# Patient Record
Sex: Female | Born: 1935 | Race: Black or African American | Hispanic: No | Smoking: Never smoker
Health system: Southern US, Community
[De-identification: ages and names within clinical notes are randomized; demographics above are authoritative.]

## PROBLEM LIST (undated history)

## (undated) DIAGNOSIS — I10 Essential (primary) hypertension: Secondary | ICD-10-CM

## (undated) DIAGNOSIS — I059 Rheumatic mitral valve disease, unspecified: Secondary | ICD-10-CM

## (undated) DIAGNOSIS — I2789 Other specified pulmonary heart diseases: Secondary | ICD-10-CM

## (undated) DIAGNOSIS — Z86718 Personal history of other venous thrombosis and embolism: Secondary | ICD-10-CM

## (undated) DIAGNOSIS — I079 Rheumatic tricuspid valve disease, unspecified: Secondary | ICD-10-CM

## (undated) DIAGNOSIS — E785 Hyperlipidemia, unspecified: Secondary | ICD-10-CM

## (undated) HISTORY — DX: Essential (primary) hypertension: I10

## (undated) HISTORY — PX: COLON SURGERY: SHX602

## (undated) HISTORY — DX: Rheumatic tricuspid valve disease, unspecified: I07.9

## (undated) HISTORY — PX: ABDOMINAL HYSTERECTOMY: SHX81

## (undated) HISTORY — DX: Other specified pulmonary heart diseases: I27.89

## (undated) HISTORY — DX: Personal history of other venous thrombosis and embolism: Z86.718

## (undated) HISTORY — PX: CHOLECYSTECTOMY: SHX55

## (undated) HISTORY — DX: Hyperlipidemia, unspecified: E78.5

## (undated) HISTORY — DX: Rheumatic mitral valve disease, unspecified: I05.9

---

## 2001-04-04 ENCOUNTER — Ambulatory Visit (HOSPITAL_COMMUNITY): Admission: RE | Admit: 2001-04-04 | Discharge: 2001-04-05 | Payer: Self-pay | Admitting: Internal Medicine

## 2001-04-05 ENCOUNTER — Encounter: Payer: Self-pay | Admitting: Internal Medicine

## 2001-09-27 ENCOUNTER — Encounter: Payer: Self-pay | Admitting: Internal Medicine

## 2001-09-27 ENCOUNTER — Ambulatory Visit (HOSPITAL_COMMUNITY): Admission: RE | Admit: 2001-09-27 | Discharge: 2001-09-27 | Payer: Self-pay | Admitting: Internal Medicine

## 2002-03-23 ENCOUNTER — Ambulatory Visit (HOSPITAL_COMMUNITY): Admission: RE | Admit: 2002-03-23 | Discharge: 2002-03-23 | Payer: Self-pay | Admitting: Internal Medicine

## 2002-03-23 ENCOUNTER — Encounter: Payer: Self-pay | Admitting: Internal Medicine

## 2003-04-19 ENCOUNTER — Encounter: Payer: Self-pay | Admitting: Internal Medicine

## 2003-04-19 ENCOUNTER — Ambulatory Visit (HOSPITAL_COMMUNITY): Admission: RE | Admit: 2003-04-19 | Discharge: 2003-04-19 | Payer: Self-pay | Admitting: Internal Medicine

## 2003-12-24 ENCOUNTER — Ambulatory Visit (HOSPITAL_COMMUNITY): Admission: RE | Admit: 2003-12-24 | Discharge: 2003-12-24 | Payer: Self-pay | Admitting: Internal Medicine

## 2004-03-19 ENCOUNTER — Inpatient Hospital Stay (HOSPITAL_COMMUNITY): Admission: EM | Admit: 2004-03-19 | Discharge: 2004-04-05 | Payer: Self-pay | Admitting: Emergency Medicine

## 2004-05-08 ENCOUNTER — Ambulatory Visit (HOSPITAL_COMMUNITY): Admission: RE | Admit: 2004-05-08 | Discharge: 2004-05-08 | Payer: Self-pay | Admitting: Internal Medicine

## 2004-07-07 ENCOUNTER — Ambulatory Visit: Payer: Self-pay | Admitting: *Deleted

## 2004-08-04 ENCOUNTER — Ambulatory Visit: Payer: Self-pay | Admitting: Internal Medicine

## 2004-08-29 ENCOUNTER — Ambulatory Visit: Payer: Self-pay | Admitting: *Deleted

## 2004-09-29 ENCOUNTER — Ambulatory Visit: Payer: Self-pay | Admitting: Cardiology

## 2004-10-27 ENCOUNTER — Ambulatory Visit: Payer: Self-pay | Admitting: *Deleted

## 2004-11-13 ENCOUNTER — Ambulatory Visit: Payer: Self-pay | Admitting: *Deleted

## 2004-11-14 ENCOUNTER — Ambulatory Visit: Payer: Self-pay | Admitting: *Deleted

## 2004-11-14 ENCOUNTER — Ambulatory Visit (HOSPITAL_COMMUNITY): Admission: RE | Admit: 2004-11-14 | Discharge: 2004-11-14 | Payer: Self-pay | Admitting: *Deleted

## 2004-11-27 ENCOUNTER — Ambulatory Visit: Payer: Self-pay | Admitting: *Deleted

## 2004-12-09 ENCOUNTER — Ambulatory Visit: Payer: Self-pay | Admitting: *Deleted

## 2004-12-12 ENCOUNTER — Ambulatory Visit: Payer: Self-pay | Admitting: Critical Care Medicine

## 2004-12-16 ENCOUNTER — Ambulatory Visit: Payer: Self-pay | Admitting: Internal Medicine

## 2005-01-09 ENCOUNTER — Ambulatory Visit (HOSPITAL_COMMUNITY): Admission: RE | Admit: 2005-01-09 | Discharge: 2005-01-09 | Payer: Self-pay | Admitting: Cardiovascular Disease

## 2005-01-13 ENCOUNTER — Ambulatory Visit (HOSPITAL_COMMUNITY): Admission: RE | Admit: 2005-01-13 | Discharge: 2005-01-13 | Payer: Self-pay | Admitting: Cardiovascular Disease

## 2005-01-13 ENCOUNTER — Ambulatory Visit: Payer: Self-pay | Admitting: Cardiology

## 2005-02-02 ENCOUNTER — Ambulatory Visit: Payer: Self-pay | Admitting: Critical Care Medicine

## 2005-05-06 ENCOUNTER — Emergency Department (HOSPITAL_COMMUNITY): Admission: EM | Admit: 2005-05-06 | Discharge: 2005-05-06 | Payer: Self-pay | Admitting: Emergency Medicine

## 2005-05-11 ENCOUNTER — Ambulatory Visit (HOSPITAL_COMMUNITY): Admission: RE | Admit: 2005-05-11 | Discharge: 2005-05-11 | Payer: Self-pay | Admitting: Internal Medicine

## 2005-06-25 ENCOUNTER — Ambulatory Visit: Payer: Self-pay | Admitting: Cardiology

## 2005-07-01 ENCOUNTER — Ambulatory Visit: Payer: Self-pay | Admitting: Cardiology

## 2005-10-04 IMAGING — US US EXTREM LOW VENOUS BILAT
1 series · 14 of 24 positions shown · non-contrast
Comparison: none.

CLINICAL DATA: known pulmonary emboli.
 BILATERAL LOWER EXTREMITY VENOUS DOPPLER EVALUATION

[Series 1: unknown · 14 of 39 slices shown]
[im 1/39]
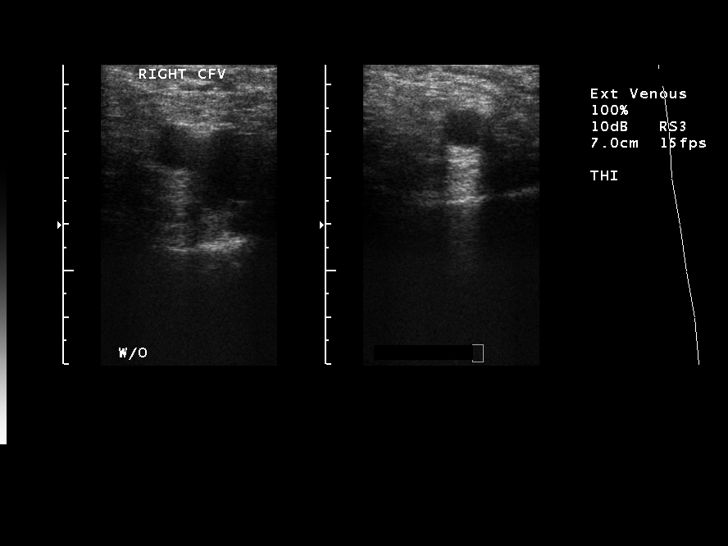
[im 4/39]
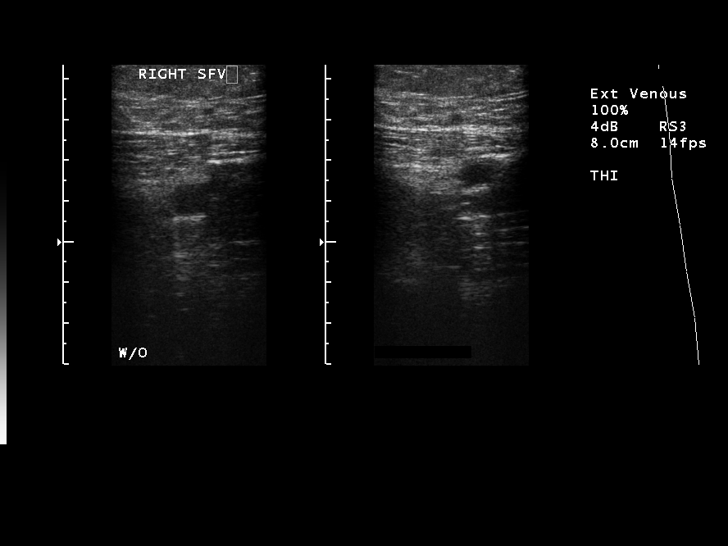
[im 7/39]
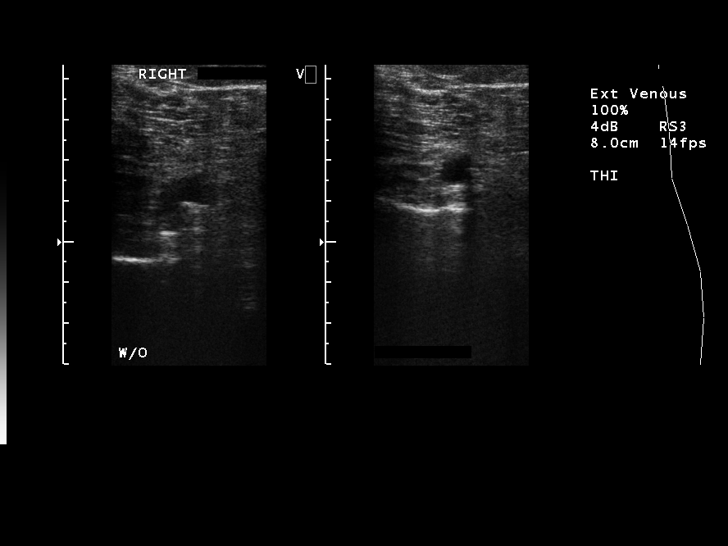
[im 10/39]
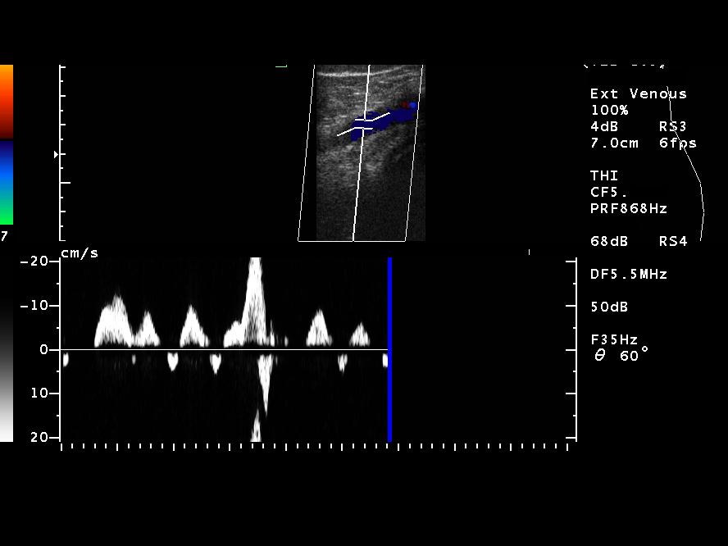
[im 12/39]
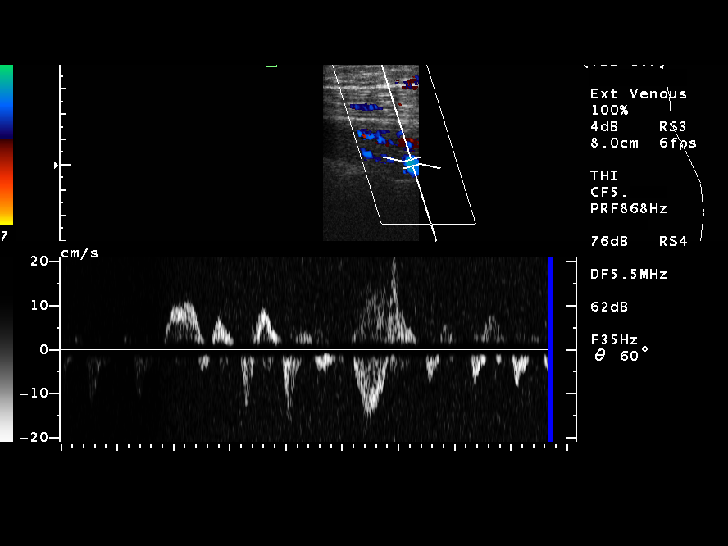
[im 15/39]
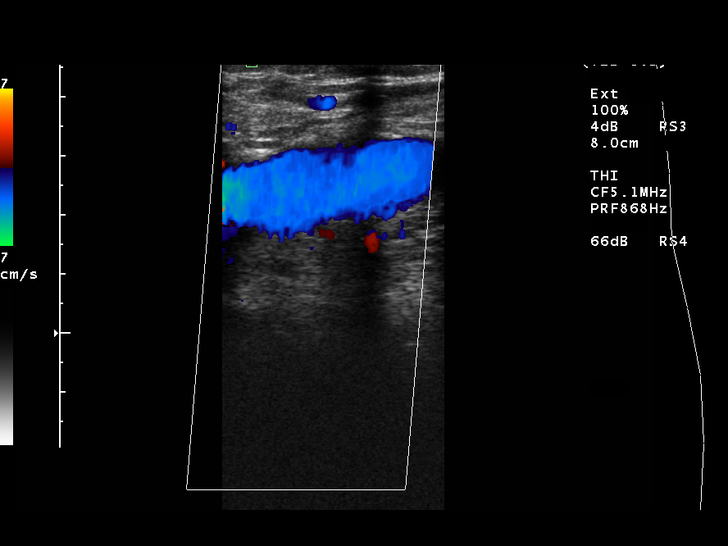
[im 19/39]
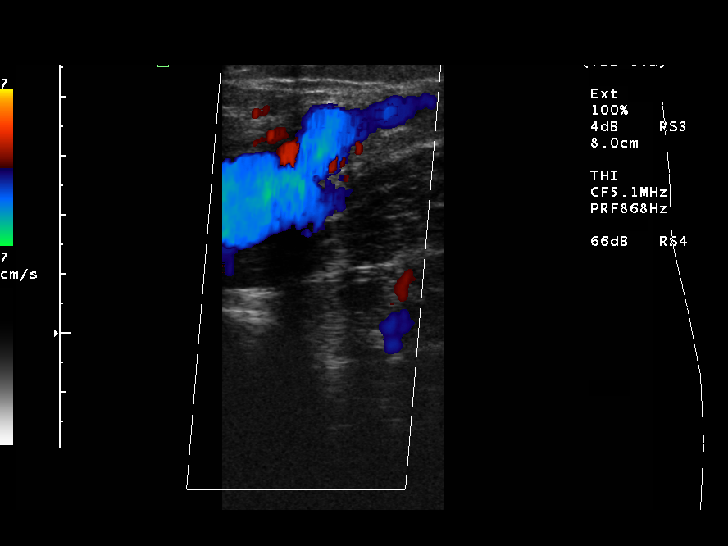
[im 20/39]
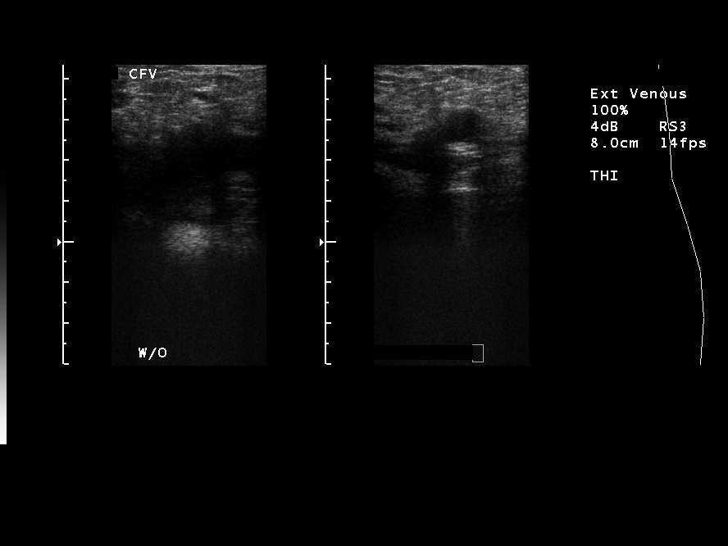
[im 24/39]
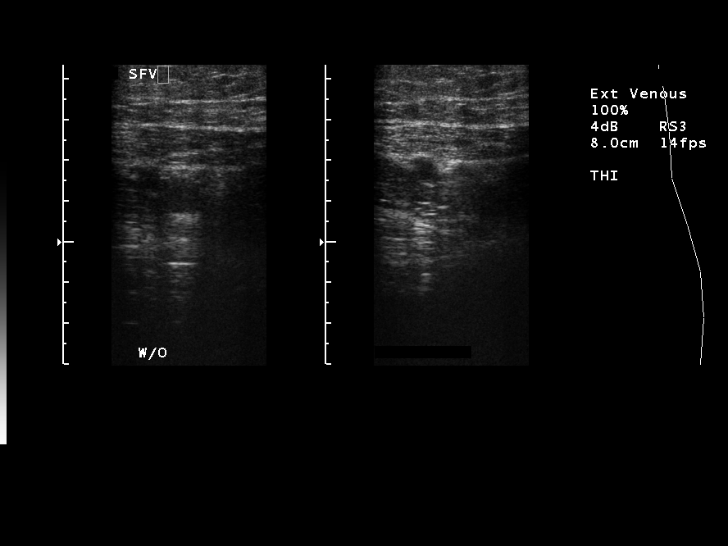
[im 27/39]
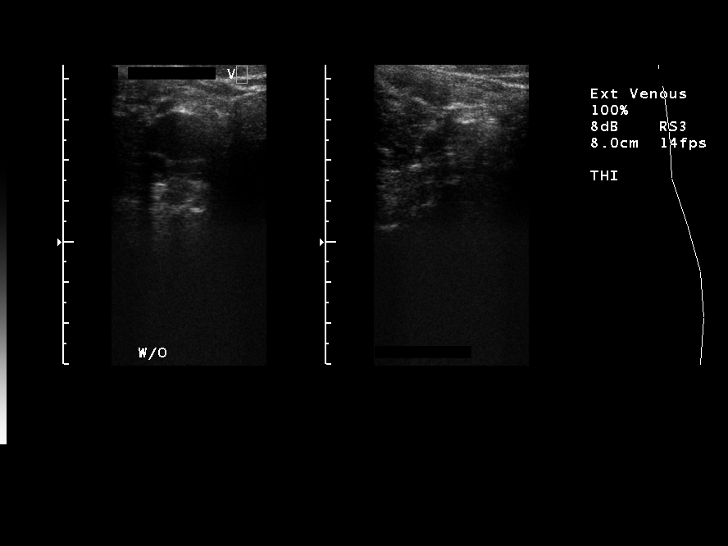
[im 30/39]
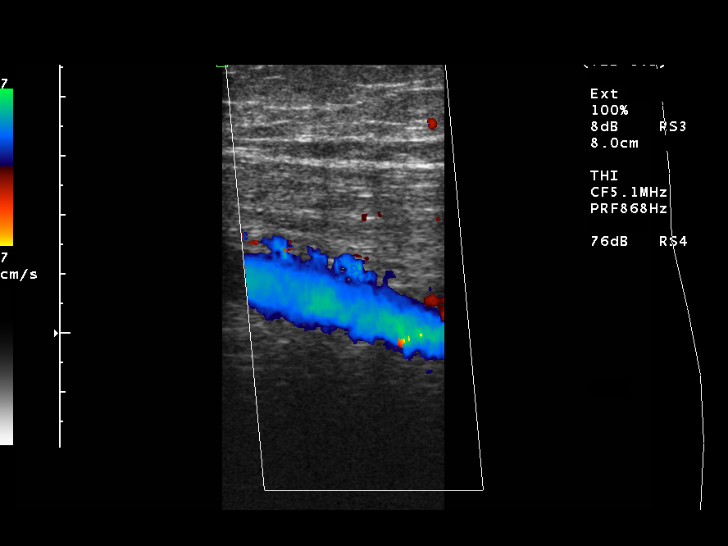
[im 32/39]
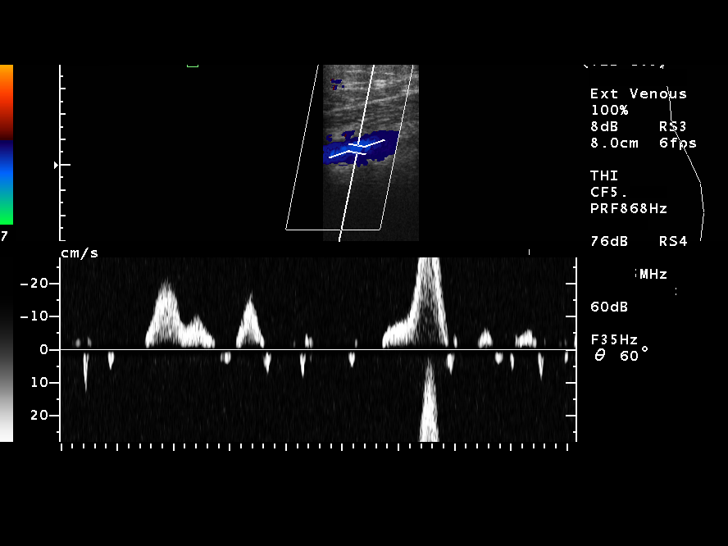
[im 35/39]
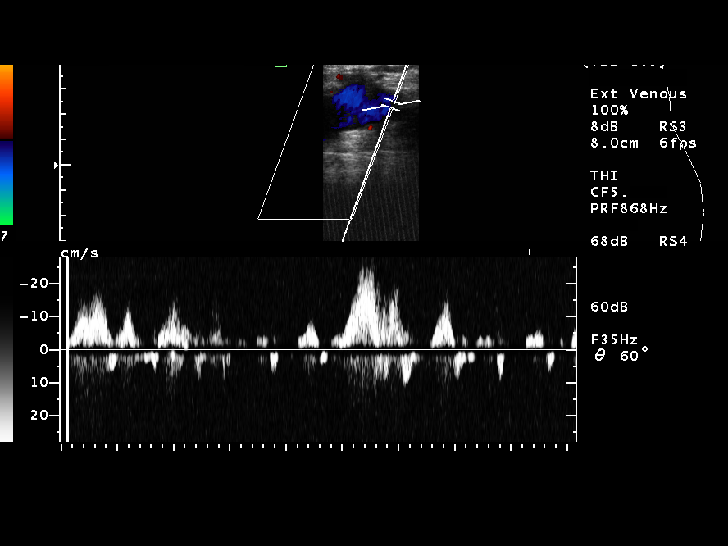
[im 39/39]
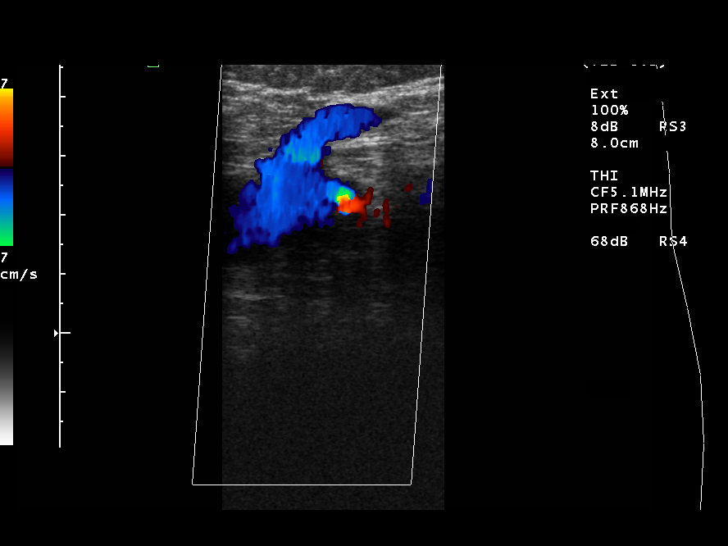

[14 of 24 positions shown; findings below may reference images not displayed]

RIGHT LOWER EXTREMITY
 The common femoral vein, superficial femoral vein, popliteal vein, and greater saphenous vein were evaluated. Each of these vessels demonstrates normal color and duplex doppler signal.  Each of the deep veins shows normal response to augmentation and normal compressibility.
 LEFT LOWER EXTREMITY
 The common femoral vein, superficial femoral vein, popliteal vein, and greater saphenous vein were evaluated. Each of these vessels demonstrates normal color and duplex doppler signal.  Each of the deep veins shows normal response to augmentation and normal compressibility.
 IMPRESSION
 Normal bilateral lower extremity venous doppler ultrasound ? no evidence of DVT.

## 2006-05-13 ENCOUNTER — Ambulatory Visit (HOSPITAL_COMMUNITY): Admission: RE | Admit: 2006-05-13 | Discharge: 2006-05-13 | Payer: Self-pay | Admitting: Internal Medicine

## 2006-05-14 ENCOUNTER — Ambulatory Visit: Payer: Self-pay | Admitting: Cardiology

## 2006-05-21 ENCOUNTER — Ambulatory Visit: Payer: Self-pay | Admitting: Cardiology

## 2007-05-20 ENCOUNTER — Ambulatory Visit (HOSPITAL_COMMUNITY): Admission: RE | Admit: 2007-05-20 | Discharge: 2007-05-20 | Payer: Self-pay | Admitting: Internal Medicine

## 2007-06-28 ENCOUNTER — Ambulatory Visit: Payer: Self-pay | Admitting: Cardiology

## 2007-06-28 ENCOUNTER — Encounter: Payer: Self-pay | Admitting: Physician Assistant

## 2007-07-05 ENCOUNTER — Ambulatory Visit: Payer: Self-pay | Admitting: Cardiology

## 2007-07-05 ENCOUNTER — Encounter: Payer: Self-pay | Admitting: Physician Assistant

## 2007-07-22 ENCOUNTER — Encounter: Payer: Self-pay | Admitting: Physician Assistant

## 2007-07-22 ENCOUNTER — Ambulatory Visit: Payer: Self-pay | Admitting: Cardiology

## 2008-05-22 ENCOUNTER — Ambulatory Visit (HOSPITAL_COMMUNITY): Admission: RE | Admit: 2008-05-22 | Discharge: 2008-05-22 | Payer: Self-pay | Admitting: Internal Medicine

## 2008-09-04 ENCOUNTER — Ambulatory Visit: Payer: Self-pay | Admitting: Cardiology

## 2008-09-13 ENCOUNTER — Ambulatory Visit: Payer: Self-pay | Admitting: Cardiology

## 2009-04-29 DIAGNOSIS — I10 Essential (primary) hypertension: Secondary | ICD-10-CM | POA: Insufficient documentation

## 2009-04-29 DIAGNOSIS — E785 Hyperlipidemia, unspecified: Secondary | ICD-10-CM | POA: Insufficient documentation

## 2009-06-03 ENCOUNTER — Ambulatory Visit (HOSPITAL_COMMUNITY): Admission: RE | Admit: 2009-06-03 | Discharge: 2009-06-03 | Payer: Self-pay | Admitting: Internal Medicine

## 2009-10-02 ENCOUNTER — Encounter: Payer: Self-pay | Admitting: Cardiology

## 2009-10-04 ENCOUNTER — Ambulatory Visit: Payer: Self-pay | Admitting: Cardiology

## 2009-10-04 DIAGNOSIS — I2789 Other specified pulmonary heart diseases: Secondary | ICD-10-CM | POA: Insufficient documentation

## 2009-10-04 DIAGNOSIS — Z86718 Personal history of other venous thrombosis and embolism: Secondary | ICD-10-CM | POA: Insufficient documentation

## 2010-01-14 ENCOUNTER — Encounter: Payer: Self-pay | Admitting: Cardiology

## 2010-06-05 ENCOUNTER — Ambulatory Visit (HOSPITAL_COMMUNITY): Admission: RE | Admit: 2010-06-05 | Discharge: 2010-06-05 | Payer: Self-pay | Admitting: Internal Medicine

## 2010-09-07 ENCOUNTER — Encounter: Payer: Self-pay | Admitting: Internal Medicine

## 2010-09-16 NOTE — Assessment & Plan Note (Signed)
Summary: 1 yr fu per jan reminder-srs   Visit Type:  Follow-up Primary Provider:  Ouida Sills  CC:  follow-up visit.  History of Present Illness: the patient is a 77 year old African-American female with a history of pulmonary hypertension.  Her pulmonary hypertension is mild.  She also has white coat hypertension.  The patient went recently to [place].  She leads an active lifestyle.  She denies any shortness of breath tightness or palpitations.  She is doing extremely well.  She denies any orthopnea PND.  She reports no syncope.  Preventive Screening-Counseling & Management  Alcohol-Tobacco     Smoking Status: never  Clinical Review Panels:  Echocardiogram Echocardiogram ejection fraction 6065%. Mild LVH Septal wall hypertrophy Diastolic filling pattern consistent with elevated mean left atrial pressure Date of his mouth moderately dilated Mild mitral regurgitation Mild tricuspid regurgitation Inferior vena cava is normal. (09/29/2009)  Cardiac Imaging Cardiac Cath Findings  CONCLUSION:  Mild pulmonary hypertension with a mean pulmonary artery  pressure of less than 25 mmHg at rest and less than 30 mmHg during exercise.  NYHA class I-II.   RECOMMENDATIONS:  1. Vasodilator testing was not performed as the patient did not meet      criteria for this test.      a. Mean pulmonary artery pressure was less than 25 mmHg and mean         pulmonary artery pressure during exertion was less than 30 mmHg.      b. The patient has a history of pulmonary embolus and thromboembolic         disease and does per se fall in the category of pulmonary arterial         hypertension benefiting from VD therapy.      c. PVR is approximately 3 Wood's units.      d. The patient is essentially asymptomatic with no significant dyspnea         and would receive no mortality benefit from vasodilator therapy.  2. Follow-up 2D echocardiographic study with saline contrast is indicated      in six months to  ascertain the stability of mild pulmonary      hypertension.  3. Follow up with Dr. Andee Lineman can be scheduled in six months if further      evaluation of pulmonary hypertension is indicated based on the      echocardiographic study.   Learta Codding, M.D. (01/14/2005)    Current Medications (verified): 1)  Aspir-Low 81 Mg Tbec (Aspirin) .... Take 1 Tablet By Mouth Once A Day 2)  Chlorthalidone 25 Mg Tabs (Chlorthalidone) .... Take 1 Tablet By Mouth Once A Day 3)  Benicar 20 Mg Tabs (Olmesartan Medoxomil) .... Take 1 Tablet By Mouth Once A Day 4)  Fish Oil 1000 Mg Caps (Omega-3 Fatty Acids) .... Take 1 Tablet By Mouth Once A Day  Allergies (verified): No Known Drug Allergies  Comments:  Nurse/Medical Assistant: The patient's medications and allergies were reviewed with the patient and were updated in the Medication and Allergy Lists. List reviewed.  Past History:  Past Medical History: Last updated: 04/29/2009 History of pulmonary hypertension.  History of tricuspid and mitral valve disease.  History of pulmonary embolism HYPERTENSION, UNSPECIFIED (ICD-401.9) HYPERLIPIDEMIA-MIXED (ICD-272.4)    Family History: Last updated: 10/04/2009 noncontributory  Social History: Last updated: 04/29/2009 Retired  Part Time  Tobacco Use - No.  Alcohol Use - no Drug Use - no  Risk Factors: Smoking Status: never (10/04/2009)  Review of Systems  The patient denies fatigue, malaise, fever, weight gain/loss, vision loss, decreased hearing, hoarseness, chest pain, palpitations, shortness of breath, prolonged cough, wheezing, sleep apnea, coughing up blood, abdominal pain, blood in stool, nausea, vomiting, diarrhea, heartburn, incontinence, blood in urine, muscle weakness, joint pain, leg swelling, rash, skin lesions, headache, fainting, dizziness, depression, anxiety, enlarged lymph nodes, easy bruising or bleeding, and environmental allergies.    Vital Signs:  Patient profile:   77  year old female Height:      65 inches Weight:      183 pounds BMI:     30.56 Pulse rate:   69 / minute BP sitting:   144 / 72  (left arm) Cuff size:   regular  Vitals Entered By: Carlye Grippe (October 04, 2009 8:58 AM)  Nutrition Counseling: Patient's BMI is greater than 25 and therefore counseled on weight management options. CC: follow-up visit   Physical Exam  Additional Exam:  General: Well-developed, well-nourished in no distress head: Normocephalic and atraumatic eyes PERRLA/EOMI intact, conjunctiva and lids normal nose: No deformity or lesions mouth normal dentition, normal posterior pharynx neck: Supple, no JVD.  No masses, thyromegaly or abnormal cervical nodes lungs: Normal breath sounds bilaterally without wheezing.  Normal percussion heart: regular rate and rhythm with normal S1 and S2, no S3 or S4.  PMI is normal.  No pathological murmurs abdomen: Normal bowel sounds, abdomen is soft and nontender without masses, organomegaly or hernias noted.  No hepatosplenomegaly musculoskeletal: Back normal, normal gait muscle strength and tone normal pulsus: Pulse is normal in all 4 extremities Extremities: No peripheral pitting edema neurologic: Alert and oriented x 3 skin: Intact without lesions or rashes cervical nodes: No significant adenopathy psychologic: Normal affect    EKG  Procedure date:  10/04/2009  Findings:      normal sinus rhythm.  Occasional PVCs.  Nonspecific ST-T wave changes.  Heart rate 69 beats/min QTC 409 ms  Impression & Recommendations:  Problem # 1:  HYPERTENSION, UNSPECIFIED (ICD-401.9) the patient has white coat hypertension.  She reports however that normally her blood pressure within normal range.  I made no changes in her medical regimen. The following medications were removed from the medication list:    Lisinopril-hydrochlorothiazide 20-25 Mg Tabs (Lisinopril-hydrochlorothiazide) .Marland Kitchen... Take 1 tablet by mouth once a day    Norvasc  2.5 Mg Tabs (Amlodipine besylate) .Marland Kitchen... Take 1 tablet by mouth once a day Her updated medication list for this problem includes:    Aspir-low 81 Mg Tbec (Aspirin) .Marland Kitchen... Take 1 tablet by mouth once a day    Chlorthalidone 25 Mg Tabs (Chlorthalidone) .Marland Kitchen... Take 1 tablet by mouth once a day    Benicar 20 Mg Tabs (Olmesartan medoxomil) .Marland Kitchen... Take 1 tablet by mouth once a day  Problem # 2:  PULMONARY HYPERTENSION, MILD (ICD-416.8) patient has mild pulmonary hypertension.  We will consider follow-up echocardiogram in one year.  She reports no symptoms of right-sided heart failure. Orders: EKG w/ Interpretation (93000)  Problem # 3:  HYPERLIPIDEMIA-MIXED (ICD-272.4) the patient's blood work is followed by Dr. Ouida Sills.  Patient Instructions: 1)  Your physician recommends that you continue on your current medications as directed. Please refer to the Current Medication list given to you today. 2)  Follow up in  1 year.

## 2010-10-29 ENCOUNTER — Encounter: Payer: Self-pay | Admitting: Cardiology

## 2010-10-29 ENCOUNTER — Ambulatory Visit (INDEPENDENT_AMBULATORY_CARE_PROVIDER_SITE_OTHER): Payer: MEDICARE | Admitting: Cardiology

## 2010-10-29 DIAGNOSIS — I10 Essential (primary) hypertension: Secondary | ICD-10-CM

## 2010-11-13 NOTE — Assessment & Plan Note (Signed)
Summary: 1 YR FUL FH   Visit Type:  Follow-up Primary Provider:  Ouida Sills   History of Present Illness: The patient is a 77 year old African-American female with history of pulmonary hypertension.  She only has mild pulmonary hypertension.  She also has white coat systemic hypertension.  During the last visit she reported no complaints.  She was doing well.  Her catheterization her mean pulmonary artery pressure was 25 mm of mercury.  The patient also has a history off  pulmonary embolism hypertension and dyslipidemia. The patient reports no symptoms.  She has been doing well here if she has no chest pain or shortness of breath.  She has gained some weight.  However there is no evidence of fluid overload.  Splenic to take a trip to [place] in the next few months. EKG demonstrates normal sinus rhythm with no acute ischemic changes.  Her blood personnel controlled.   Preventive Screening-Counseling & Management  Alcohol-Tobacco     Smoking Status: never  Current Medications (verified): 1)  Aspir-Low 81 Mg Tbec (Aspirin) .... Take 1 Tablet By Mouth Once A Day 2)  Chlorthalidone 25 Mg Tabs (Chlorthalidone) .... Take 1 Tablet By Mouth Once A Day 3)  Benicar 20 Mg Tabs (Olmesartan Medoxomil) .... Take 1 Tablet By Mouth Once A Day 4)  Fish Oil 1000 Mg Caps (Omega-3 Fatty Acids) .... Take 1 Tablet By Mouth Once A Day 5)  Simvastatin 10 Mg Tabs (Simvastatin) .... Take 1 Tablet By Mouth Once A Day  Allergies (verified): No Known Drug Allergies  Comments:  Nurse/Medical Assistant: The patient's medication list and allergies were reviewed with the patient and were updated in the Medication and Allergy Lists.  Past History:  Past Medical History: Last updated: 04/29/2009 History of pulmonary hypertension.  History of tricuspid and mitral valve disease.  History of pulmonary embolism HYPERTENSION, UNSPECIFIED (ICD-401.9) HYPERLIPIDEMIA-MIXED (ICD-272.4)    Family History: Last updated:  10/04/2009 noncontributory  Social History: Last updated: 04/29/2009 Retired  Part Time  Tobacco Use - No.  Alcohol Use - no Drug Use - no  Risk Factors: Smoking Status: never (10/29/2010)  Vital Signs:  Patient profile:   77 year old female Height:      65 inches Weight:      191 pounds BMI:     31.90 Pulse rate:   56 / minute BP sitting:   116 / 71  (left arm) Cuff size:   large  Vitals Entered By: Carlye Grippe (October 29, 2010 11:37 AM)  Nutrition Counseling: Patient's BMI is greater than 25 and therefore counseled on weight management options.  Physical Exam  Additional Exam:  General: Well-developed, well-nourished in no distress head: Normocephalic and atraumatic eyes PERRLA/EOMI intact, conjunctiva and lids normal nose: No deformity or lesions mouth normal dentition, normal posterior pharynx neck: Supple, no JVD.  No masses, thyromegaly or abnormal cervical nodes lungs: Normal breath sounds bilaterally without wheezing.  Normal percussion heart: regular rate and rhythm with normal S1 and S2, no S3 or S4.  PMI is normal.  No pathological murmurs abdomen: Normal bowel sounds, abdomen is soft and nontender without masses, organomegaly or hernias noted.  No hepatosplenomegaly musculoskeletal: Back normal, normal gait muscle strength and tone normal pulsus: Pulse is normal in all 4 extremities Extremities: No peripheral pitting edema neurologic: Alert and oriented x 3 skin: Intact without lesions or rashes cervical nodes: No significant adenopathy psychologic: Normal affect    Impression & Recommendations:  Problem # 1:  PULMONARY HYPERTENSION, MILD (ICD-416.8)  mild pulmonary hypertension: no evidence of symptoms.  I do not think the patient needs a repeat echocardiogram.  Problem # 2:  HYPERTENSION, UNSPECIFIED (ICD-401.9) white coat hypertension stable Her updated medication list for this problem includes:    Aspir-low 81 Mg Tbec (Aspirin) .Marland Kitchen... Take 1  tablet by mouth once a day    Chlorthalidone 25 Mg Tabs (Chlorthalidone) .Marland Kitchen... Take 1 tablet by mouth once a day    Benicar 20 Mg Tabs (Olmesartan medoxomil) .Marland Kitchen... Take 1 tablet by mouth once a day  Orders: EKG w/ Interpretation (93000)  Problem # 3:  PULMONARY EMBOLISM, HX OF (ICD-V12.51) pulmonary embolism will no recurrence Her updated medication list for this problem includes:    Aspir-low 81 Mg Tbec (Aspirin) .Marland Kitchen... Take 1 tablet by mouth once a day  Problem # 4:  HYPERLIPIDEMIA-MIXED (ICD-272.4) dyslipidemia: The patient will follow-up with Dr. Ouida Sills regarding blood work which is scheduled for June. Her updated medication list for this problem includes:    Simvastatin 10 Mg Tabs (Simvastatin) .Marland Kitchen... Take 1 tablet by mouth once a day  Patient Instructions: 1)  Your physician wants you to follow-up in: 1 year. You will receive a reminder letter in the mail one-two months in advance. If you don't receive a letter, please call our office to schedule the follow-up appointment. 2)  Your physician recommends that you continue on your current medications as directed. Please refer to the Current Medication list given to you today.

## 2010-12-30 NOTE — Assessment & Plan Note (Signed)
[facility]                          [facility] OFFICE NOTE   NAME:[name], [name] ALVIAR                    MRN:          [id number]  DATE:06/28/2007                            DOB:          [birth year]    CARDIOLOGIST:  [name] Codding, MD,FACC   PRIMARY CARE PHYSICIAN:  [name] Callander. Ouida Sills, MD   REASON FOR VISIT:  One year followup.   HISTORY OF PRESENT ILLNESS:  [name] [name] is a very pleasant 77 year old  female patient with a history of pulmonary embolism in 2005 after  surgery.  Right heart catheterization in 2006 revealed mild pulmonary  hypertension.  She has seen Dr. Delford [name] in the past.  She presents to the  office today for routine followup.   Overall, the patient is doing well.  She denies any chest pain or  shortness of breath.  She denies any syncope or presyncope.  Denies any  palpitations.  Denies any orthopnea, PND or pedal edema.   CURRENT MEDICATIONS:  1. Aspirin 81 mg daily.  2. Lisinopril 10 mg daily.  3. Klonopin p.r.n.   PHYSICAL EXAMINATION:  GENERAL: She is a well-nourished, well-developed  female, no acute distress.  VITAL SIGNS:  Blood pressure is 168/95, pulse 55, weight 180.8 pounds.  Repeat blood pressure by me by manual cuff is 162/90 on the left, 170/92  on the right.  HEENT:  Normal.  NECK:  Without JVD.  CARDIAC:  Regular S1, S2.  Regular rate and rhythm without murmur.  LUNGS:  Clear to auscultation bilaterally without wheezes, rales or  rhonchi.  ABDOMEN:  Soft, nontender, normoactive bowel sounds.  No organomegaly.  EXTREMITIES:  Without edema.  Calves are soft and nontender.  SKIN:  Warm and dry.  NEUROLOGICAL:  Alert and oriented x3.  Cranial nerves II-XII are grossly  intact.   STUDIES:  Electrocardiogram reveals sinus rhythm with a heart rate of  67, normal axis.  No acute changes.  Occasional PACs.   IMPRESSION:  1. Mild pulmonary hypertension.  2. History of pulmonary embolus status post surgery.  3.  Hypertension, uncontrolled.  4. History of dyslipidemia, subsequently no medical therapy at this      time.   PLAN:  1. The patient presents today for follow up.  Overall, she is doing      well.  According to the notes, she is supposed to have an      echocardiogram performed last year.  However, this was never      performed.  We will go ahead and send her for a repeat      echocardiogram to reassess her pulmonary hypertension.  2. Her blood pressure is uncontrolled.  I recommended we change her      Lisinopril to Lisinopril/HCTZ 10/12.5 mg daily.  She will have a B-      met in a week and a blood pressure check with the nurse.  3. We will see the patient back in follow up in one year or sooner      p.r.n.      Tereso Newcomer, PA-C  Electronically Signed  [name] Codding, MD,FACC  Electronically Signed   SW/MedQ  DD: 06/28/2007  DT: 06/29/2007  Job #: [id number]   cc:   [name] Callander. Ouida Sills, MD

## 2010-12-30 NOTE — Assessment & Plan Note (Signed)
[facility]                          [facility]ICE NOTE   NAME:[name] [name], [name] [name]                    MRN:          [id number]  DATE:09/04/2008                            DOB:          May 15, 1933    HISTORY OF PRESENT ILLNESS:  The patient is a pleasant 77 year old  female with history of pulmonary embolism in 2005, after surgery.  She  does have known valvular heart disease with tricuspid and mitral  regurgitation.  However, the patient appears to be in NYHA Class I.  She  reports no chest pain or shortness of breath.  She has no orthopnea or  PND.  Her main complaints are some complaints of abdominal gas.   MEDICATIONS:  1. Aspirin 81 mg p.o. daily.  2. Chlorthalidone 25 mg p.o. daily.  3. Benicar 20 mg p.o. daily.   PHYSICAL EXAMINATION:  VITAL SIGNS:  Blood pressure is 118/60, heart  rate 70, and weight is 179 pounds.  NECK:  Normal carotid upstroke.  No carotid bruits.  LUNGS:  Clear breath sounds bilaterally.  HEART:  Regular rate and rhythm.  Normal S1 and S2.  No murmurs, rubs,  or gallops.  ABDOMEN:  Soft and nontender.  No rebound or guarding.  Good bowel  sounds.  EXTREMITIES:  No cyanosis, clubbing, or edema.   A 12-lead electrocardiogram, normal sinus rhythm, nonspecific ST-T wave  changes.   PROBLEMS:  1. History of pulmonary embolism status post surgery, off Coumadin.  2. Hypertension, controlled.  3. Dyslipidemia, followed by Dr. Ouida Sills.  4. History of pulmonary hypertension.  5. History of tricuspid and mitral valve disease.   PLAN:  1. Although, the patient is NYHA Class I, she did have significant      mitral and tricuspid valve disease, and we will have to follow up 2-      D echocardiographic study.  2. No further adjustment of her medication is needed for her blood      pressure as it is nicely controlled.  3. The patient can follow up with [place] in 1 year.    Learta Codding, MD,FACC  Electronically Signed   GED/MedQ  DD: 09/04/2008  DT: 09/05/2008  Job #: [id number]   cc:   Kingsley Callander. Ouida Sills, MD

## 2011-01-02 NOTE — Procedures (Signed)
NAME:  [name] [name], [name] [name]                       ACCOUNT NO.:  [id number]   MEDICAL RECORD NO.:  [id number]                   PATIENT TYPE:  INP   LOCATION:  A325                                 FACILITY:  [facility]   PHYSICIAN:  Vida Roller, M.D.                DATE OF BIRTH:  [birth year]   DATE OF PROCEDURE:  DATE OF DISCHARGE:                                  ECHOCARDIOGRAM   TAPE NUMBER:  [id number], tape count 0 through 482.   INDICATIONS:  A 77 year old woman preoperative for cholecystectomy.  This is  an assessment for LV function.   TECHNICAL QUALITY:  Adequate.   M-MODE TRACING:  1. Aorta is 25 mm.  2. Left atrium 41 mm.  3. Septum 15 mm.  4. Left ventricular posterior wall 11 mm.  5. Left ventricular diastolic dimension 38 mm.  6. Left ventricular systolic dimension 24 mm.   2-D AND DOPPLER IMAGING:  1. The left ventricle is normal size with vigorous systolic function with an     estimated ejection fraction of 70 to 75%.  There is evidence of upper     septal hypertrophy without any obvious left ventricular outflow gradient.     Overall, there is very-mild, concentric left ventricular hypertrophy.     Diastolic function was not assessed.  2. The right ventricle is normal size with normal systolic function.  There     is evidence of increased right ventricular systolic pressure with a     tricuspid regurgitation jet of about 3.1 meters per second, giving an     estimated right ventricular systolic pressure of between 35 and 40 mmHg.  3. Both atria appear to be mildly enlarged, left greater than right.  The     atrial septum is bowed to the right, indicating left atrial volume     overload.  4. The aortic valve is sclerotic with no evidence of stenosis or     regurgitation.  5. The mitral valve is mildly myomatous with mild regurgitation.  No     stenosis is seen.  6. The tricuspid valve is not well seen, but there is mild regurgitation.  7. The pulmonic valve is not well  seen.  8. The inferior vena cava is mildly dilated.  9. The ascending aorta was not well seen.  10.      Pericardial structures appear normal.   These results will be forwarded to Dr. Katrinka Blazing via phone.      ___________________________________________                                            Vida Roller, M.D.   JH/MEDQ  D:  03/21/2004  T:  03/21/2004  Job:  [id number]   cc:   Jerolyn Shin C. Katrinka Blazing, M.D.  [address]  [place]  [place] [address]  Fax: [phone]

## 2011-01-02 NOTE — Cardiovascular Report (Signed)
NAMETRULEE, HAMSTRA NO.:  [id number]   MEDICAL RECORD NO.:  [id number]          PATIENT TYPE:  OIB   LOCATION:  2856                         FACILITY:  [facility]   PHYSICIAN:  Learta Codding, M.D. LHCDATE OF BIRTH:  08-Mar-1933   DATE OF PROCEDURE:  01/14/2005  DATE OF DISCHARGE:  01/13/2005                              CARDIAC CATHETERIZATION   PROCEDURE:  Right heart catheterization.   CARDIOLOGIST:  Vida Roller, M.D.   PROCEDURE PERFORMED:  Right heart catheterization.   INDICATIONS:  The patient is a 77 year old female with a history of prior  pulmonary embolism in 2005 after hernia surgery.  Follow-up CT scan  demonstrated no residual pulmonary embolism.  The patient is NYHA class I-  II.  A 2D echocardiographic study demonstrated possible residual pulmonary  hypertension and the patient has been referred for right heart  catheterization to assess her pulmonary artery pressures and possible need  for long-term vasodilator therapy and vasodilator therapy reactive testing.  The patient also has a history of significant hypertension and has left  ventricular hypertrophy by echocardiography.   DESCRIPTION OF PROCEDURE:  After informed consent was obtained, the patient  was brought to the catheterization laboratory.  The right groin was  sterilely prepped and draped.  A standard Swan-Ganz catheter was inserted  through the right femoral vein using the modified Seldinger technique after  an 8 French sheath was placed.  Appropriate right-sided hemodynamics were  obtained as well as cardiac output.  Adenosine was not infused due to the  fact that the patient did not have significant pulmonary hypertension, did  not meet criteria for vasodilator testing.   COMPLICATIONS:  None.   FINDINGS:  a.  Resting hemodynamics:  Pulmonary artery pressure 35/50/23  mmHg.  Pulmonary capillary wedge pressure mean of 13 mmHg.  Peak cardiac  output 4.1 liters per minute.  Peak  cardiac index 2.14 liters per minute.  Cardiac output by thermodilution 5 liters per minute.  Cardiac index by  thermodilution 2.6 liters per minute.  PVR was 240 dynes per m2,  (approximately 3 Wood's units).  b.  Hemodynamics during exercise.  Right atrial pressure was 12/11/9 mmHg.  Ventricular pressure was 41/18/11 mmHg.  Pulmonary artery pressure was  42/17/27 mmHg.   CONCLUSION:  Mild pulmonary hypertension with a mean pulmonary artery  pressure of less than 25 mmHg at rest and less than 30 mmHg during exercise.  NYHA class I-II.   RECOMMENDATIONS:  1. Vasodilator testing was not performed as the patient did not meet      criteria for this test.      a. Mean pulmonary artery pressure was less than 25 mmHg and mean         pulmonary artery pressure during exertion was less than 30 mmHg.      b. The patient has a history of pulmonary embolus and thromboembolic         disease and does per se fall in the category of pulmonary arterial         hypertension benefiting from VD therapy.      c. PVR  is approximately 3 Wood's units.      d. The patient is essentially asymptomatic with no significant dyspnea         and would receive no mortality benefit from vasodilator therapy.  2. Follow-up 2D echocardiographic study with saline contrast is indicated      in six months to ascertain the stability of mild pulmonary      hypertension.  3. Follow up with Dr. Andee Lineman can be scheduled in six months if further      evaluation of pulmonary hypertension is indicated based on the      echocardiographic study.        GED/MEDQ  D:  01/14/2005  T:  01/14/2005  Job:  [id number]   cc:   Vida Roller, M.D.  Fax: [phone]   Shan Levans, M.D. [facility]

## 2011-01-02 NOTE — Consult Note (Signed)
NAME:  KYLE, STANSELL                       ACCOUNT NO.:  [id number]   MEDICAL RECORD NO.:  [id number]                   PATIENT TYPE:  INP   LOCATION:  A206                                 FACILITY:  [facility]   PHYSICIAN:  Charlton Haws, M.D.                  DATE OF BIRTH:  15-Feb-1933   DATE OF CONSULTATION:  DATE OF DISCHARGE:                                   CONSULTATION   HISTORY OF PRESENT ILLNESS:  Mrs. Daponte is a delightful 77 year old  patient of Dr. Alonza Smoker and Dr. Michaelle Copas.  She is status post laparoscopic  cholecystectomy.   Preoperatively there was no history of cardiac problems.  She did have  hypertension and was treated with Benicar.  She had a screening  echocardiogram prior to her surgery, which was essentially normal with  hyperdynamic LV function and mild LVH.   Postoperatively she had some poor oxygen saturations and a near syncopal  episode.  Her D-dimer was elevated at 6.8.  She had evidence for fluid  overload and right lower lung pulmonary embolus.   During the postoperative period she also had a short burst of PSVT.   The patient has done well since being diuresed and being placed on Coumadin.  Her shortness of breath has improved.  She has not been febrile.  She did  have some atypical chest pain around the time of her PE.   She has not had any chest pain since.   Her INR is currently therapeutic and I suspect she will be ready for  discharge later this week.   REVIEW OF SYSTEMS:  Remarkable for being fairly active.  She still works  part-time in a bank.  Her daughter, who lives in [place], is a Landscape architect and is with her today.   SOCIAL HISTORY:  The patient does not smoke or drink excessively.  She is  currently ambulatory in the hall.   FAMILY HISTORY:  Positive for hypertension on the mother's side.   CURRENT MEDICATIONS:  1. Protonix 40 mg a day.  2. Reglan 10 mg a day.  3. Coumadin as directed.  4. Lasix 40 mg a day.  5.  Lopressor 50 mg a day.  6. Zofran p.r.n.   ALLERGIES:  She has no known allergies.   ADDENDUM:  Note should also be made that the patient did have a recent plane  trip to [place] a couple of months ago to see her daughter.  At the time  she had a gallbladder attack.  She was active after she came home and I  suspect her PE was mostly due to bed rest post laparoscopic cholecystectomy.   PHYSICAL EXAMINATION:  VITAL SIGNS:  Blood pressure 140/80, pulse is 70 with  occasional PACs.  She continues to have decreased breath sounds bilaterally,  right greater than left with atelectasis and probable continued fluid.  NECK:  Veins are mildly elevated.  HEART:  Normal S1 S2 heart sounds.  Surgical scar is well healed.  EXTREMITIES:  Distal pulses are intact with no edema.   LABORATORY DATA:  EKG is essentially normal with occasional PACs.  Postoperatively the patient's CPKs and troponins were negative.  As  indicated, her D-dimer was elevated.   A chest x-ray continued to show some atelectasis with fusion, right greater  than left.   IMPRESSION:  The patient is doing better postoperatively.  I am not sure  that there is an acute cardiac problem.  I would continue to treat her with  Lopressor 50 mg a day for her PSVT.   After discharge she should follow with Dr. Dorethea Clan in 3-4 weeks.  I would do  a followup echocardiogram to reassess, particularly right ventricular  function.   At some point in time, if her aeration and lung examination does not  improve, I would consider a computed tomographic scan to make sure there has  not been hemorrhagic transformation of the right lower lobe.   I would continue the patient on 40 of Lasix with potassium supplementation.   She will continue her Benicar after about 2 weeks, particularly if her chest  x-ray improves.  I would leave her only on hydrochlorothiazide for a  diuretic.   In approximately 2-3 months she can have a followup stress test,  given her  age and risk factors, particularly her hypertension.  We can evaluate her  blood pressure response to exercise.   It would also be convenient for the patient to follow up in our Coumadin  clinic.  She should be on Coumadin for at least 6 months after her pulmonary  emboli.   I discussed these plans with the daughter and the patient and they seem to  agree.      ___________________________________________                                            Charlton Haws, M.D.   PN/MEDQ  D:  03/31/2004  T:  03/31/2004  Job:  [id number]   cc:   Jerolyn Shin C. Katrinka Blazing, M.D.  [address]  [place]  [place] [address]  Fax: [phone]   Kingsley Callander. Ouida Sills, M.D.  [address]  [place]  [place] [address]  Fax: [phone]   Vida Roller, M.D.  Fax: [phone]

## 2011-01-02 NOTE — H&P (Signed)
NAME:  [name] [name], [name] [name]                       ACCOUNT NO.:  [id number]   MEDICAL RECORD NO.:  [id number]                   PATIENT TYPE:  EMS   LOCATION:  ED                                   FACILITY:  [facility]   PHYSICIAN:  Dirk Dress. Katrinka Blazing, M.D.                DATE OF BIRTH:  [birth year]   DATE OF ADMISSION:  03/19/2004  DATE OF DISCHARGE:                                HISTORY & PHYSICAL   HISTORY OF PRESENT ILLNESS:  [age]-year-old female admitted for  treatment of severe abdominal pain.  The patient gives a history of onset of  severe abdominal pain about 24 hours ago.  The pain was in her  midepigastrium and radiated through to her back.  She had some nausea but no  vomiting.  The pain became much worse last evening but was better this  morning.  While trying to work today her pain became much worse.  She came  to the emergency room for evaluation where she was noted to have an acutely  tender abdomen.  Ultrasound was done and it shows multiple gallstones with a  very large gallstone measuring 2 cm that does not seem to be impacted.  Patient is admitted and we will make plans for her to have a cholecystectomy  this admission.   PAST HISTORY:  She has a history of hypertension.  She is followed primarily  by Dr. Ouida Sills.  She has no other major medical illness.   SURGERY:  Tubal ligation, hysterectomy, and left breast biopsy in the remote  past.   MEDICATIONS:  Benicar/HCT 40/25 one half once daily and potassium chloride  20 mEq b.i.d.   ALLERGIES:  She has no known drug allergies.   SOCIAL HISTORY:  She is a widow.  She lives with one of her children.  She  is employed part time at the [employer].  She does not drink,  smoke, or use drugs.  There is no prior history of any use of these  substances.  The patient had a screening colonoscopy in May 2005 and this  was normal.   PHYSICAL EXAMINATION:  GENERAL:  She appears to be comfortable at this time  but she has  had IV analgesics.  VITAL SIGNS:  Blood pressure is 128/81, pulse 62, respirations 18,  temperature 98.4, O2 saturations 100% on room air.  HEENT:  Unremarkable.  NECK:  Supple.  No JVD, adenopathy, thyromegaly, or bruit.  CHEST:  Clear to auscultation.  No rubs, rales, rhonchi, or wheezes.  HEART:  Regular rate and rhythm without murmur, gallop, or rub.  Her heart  rate is slow between 50 and 60.  She however, does have a regular pattern.  ABDOMEN:  Moderate epigastric and right upper quadrant tenderness.  Normal  bowel sounds.  No left upper quadrant tenderness.  Well-healed lower midline  incision.  No lower abdominal tenderness.  Normoactive bowel sounds.  EXTREMITIES:  No cyanosis, clubbing, or edema.  Good peripheral pulses.  No  major joint deformity.  Good range of motion of hips, knees, ankles, and  other joints.  NEUROLOGIC:  No motor, sensory, or cerebellar deficits.  Cranial nerves II-  XII intact.  Deep tendon reflexes are symmetric and intact.   IMPRESSION:  1. Cholelithiasis with severe biliary colic and early cholecystitis.  2. Hypertension.   PLAN:  Patient is admitted for pain control and for control of nausea.  Because of her age and her history of hypertension and her history of  intermittent chest pain we will get an echocardiogram.  If this is normal we  will proceed with a cholecystectomy during this admission.  If the  echocardiogram is abnormal it is felt that she should have a nuclear stress  test prior to surgery.     ___________________________________________                                         Dirk Dress Katrinka Blazing, M.D.   LCS/MEDQ  D:  03/19/2004  T:  03/19/2004  Job:  [id number]   cc:   Kingsley Callander. Ouida Sills, M.D.  [address]  [place]  [place] [address]  Fax: [phone]

## 2011-01-02 NOTE — Procedures (Signed)
NAME:  [name] [name], [name] [name] NO.:  [id number]   MEDICAL RECORD NO.:  [id number]          PATIENT TYPE:  OUT   LOCATION:  RAD                           FACILITY:  [facility]   PHYSICIAN:  Vida Roller, M.D.   DATE OF BIRTH:  1933/03/05   DATE OF PROCEDURE:  11/14/2004  DATE OF DISCHARGE:                                  ECHOCARDIOGRAM   PRIMARY CARE PHYSICIAN:  Kingsley Callander. Ouida Sills, MD.   TAPE NUMBER:  [id number].   TAPE COUNT:  [id number]   This is a 77 year old woman who had a relatively large pulmonary embolus and  has elevated right heart pressures on her most recent echocardiogram from  August 2005.  At that time, she had estimated right ventricular systolic  pressure between 35 and 40 mmHg.  Today's study was technically adequate.   M-MODE TRACING:  The aorta is 27 mm.   The left atrium is 45 mm.   The septum is 12 mm.   Posterior wall is 12 mm.   Left ventricular diastolic dimension is 38 mm.   Left ventricular systolic dimension is 23 mm.   2D AND DOPPLER IMAGING:  The left ventricle is normal size with vigorous  systolic function, estimated ejection fraction 70% to 75%.  No obvious wall  motion abnormalities.  There is concentric left ventricular hypertrophy.   The right ventricle is top limits of normal with no obvious right  ventricular hypertrophy.  Estimated right ventricular systolic pressure from  the tricuspid regurgitation velocity is 35 to 40 mmHg.  There is biatrial  enlargement.   The aortic valve is mildly sclerotic with no stenosis.  There is trivial  insufficiency.   The mitral valve has mildly thickened leaflets with mild regurgitation.  No  stenosis is seen.   There is mild tricuspid regurgitation.   There is no pericardial effusion.   The inferior vena cava appears to be normal size.   The ascending aorta on some views appears to be slightly dilated but is not  entirely interrogated.   INTERPRETATION:  This is an echocardiogram which was  unchanged from  previous.      JH/MEDQ  D:  11/14/2004  T:  11/14/2004  Job:  [id number]   cc:   Kingsley Callander. Ouida Sills, MD  [address]  [place]  [place] [address]  Fax: [phone]

## 2011-01-02 NOTE — Op Note (Signed)
NAME:  SUSANNA, BENGE NO.:  [id number]   MEDICAL RECORD NO.:  [id number]                   PATIENT TYPE:   LOCATION:                                       FACILITY:   PHYSICIAN:  Dirk Dress. Katrinka Blazing, M.D.                DATE OF BIRTH:   DATE OF PROCEDURE:  03/21/2004  DATE OF DISCHARGE:                                 OPERATIVE REPORT   PREOPERATIVE DIAGNOSIS:  Cholelithiasis, cholecystitis.   POSTOPERATIVE DIAGNOSIS:  Cholelithiasis, cholecystitis.   PROCEDURE:  Laparoscopic cholecystectomy.   SURGEON:  Dirk Dress. Katrinka Blazing, M.D.   DESCRIPTION OF PROCEDURE:  Under general endotracheal anesthesia the abdomen  was prepped and draped in a sterile field.  A supraumbilical midline  incision was made.  A Veress needle was inserted uneventfully and the  abdomen was insufflated with 3 liters of CO2.  Using a Vis-A-Port guide a 10-  mm port was placed uneventfully.  A laparoscope was placed.  Slightly  edematous gallbladder was noted.  It was also noted that the patient had  multilobular right lobe with near complete split of the lower half of the  right lobe.   Under videoscopic guidance a 10-mm port and two 5-mm ports were placed.  The  dome of the gallbladder was grasped. I could not see the infundibulum of the  gallbladder nor could I see the ductal structures.  An epigastric midline 12-  mm port was placed under videoscopic guidance and a fan retractor was  placed.  Using the fan retractor the medial aspect of the right lobe was  then elevated out of the operative field.  Dissection was stated on the  cystic duct.  After much dissection it was quite apparent that the cystic  duct was extremely short, and I could not tell whether or not there was a  fusion between the cystic duct and the common bile duct.  (It was,  therefore, elected to take the gallbladder dome down).   The serosa covering the gallbladder at the apex of the dome was taken down.  There was  significant edema of the posterior wall.  With good vision the  dome of the gallbladder was dissected and it continued down to the cystic  artery.  There were three identifiable cystic artery branches which were  dissected, clipped with 3 clips and divided.  Dissection was then continued  down to the cystic duce.  Further dissection of the cystic duct was carried  out until the gallbladder was only attached by the cystic duct.  The cystic  duct segment was very short.  I took care to make sure that it was not  associated with the common duct in any way.  After being assured of this  fact, the cystic duct was clipped with 4 clips and was divided at the  junction between the infundibulum and the duct.  It was a small vessel that  had to be grasped and clipped separately.  This was achieved without much  difficulty.  Hemostasis on the bed was achieved.  A JP drain was placed and  brought out through the most lateral incision.   Irrigation was carried out.  The bed was clear.  Under videoscopic guidance  the fan retractor and the graspers were removed.  CO2 was then allowed to  escape from the abdomen. The top of the ports were removed and all CO2 was  expressed from the abdomen by compression.  The ports were then removed.  The fascia of the supraumbilical and the upper paramedian incisions were  closed with #0 Dexon.  All incisions were then closed with staples on the  skin.  The JP drain was sutured with 3-0 Nylon.  The patient tolerated the  procedure well.  Dressings were placed. She was awakened from anesthesia  uneventfully, transferred to a bed, and taken to the postanesthetic care  unit for monitoring.      ___________________________________________                                            Dirk Dress. Katrinka Blazing, M.D.   LCS/MEDQ  D:  03/21/2004  T:  03/23/2004  Job:  [id number]

## 2011-01-02 NOTE — H&P (Signed)
NAME:  [name] [name], [name] [name] NO.:  [id number]   MEDICAL RECORD NO.:  [id number]          PATIENT TYPE:  OIB   LOCATION:  2899                         FACILITY:  [facility]   PHYSICIAN:  [name] [name], M.D.     DATE OF BIRTH:  September 19, 1932   DATE OF ADMISSION:  01/09/2005  DATE OF DISCHARGE:                                HISTORY & PHYSICAL   CHIEF COMPLAINT:  Pulmonary hypertension.   HISTORY OF PRESENT ILLNESS:  [name] [name] is a 77 year old lady with a  history of pulmonary embolus after a cholecystectomy.  She saw Dr. Dorethea Clan in  routine follow-up visit and he ordered an echocardiogram.  The  echocardiogram showed a normal EF but she had elevated pulmonary pressures.  A CT scan showed no evidence of pulmonary emboli.  She was referred to Dr.  Delford Field and it was recommended that she have a right heart catheterization  with adenosine.  She is here today for the procedure.   [name] [name] denies chest pain or shortness of breath.  She says that she  has slight dyspnea on exertion doing her normal activities but this has not  changed recently.  She denies any peripheral edema.  She is without symptoms  on examination.   PAST MEDICAL HISTORY:  1.  Hypertension.  2.  Pulmonary embolus.  3.  Anticoagulation with Coumadin.   PAST SURGICAL HISTORY:  1.  Cholecystectomy.  2.  Hysterectomy.  3.  Breast surgery secondary to carcinoma 25 years ago.   ALLERGIES:  No known drug allergies.   CURRENT MEDICATIONS:  1.  Klor-Con 20 mEq daily.  2.  Norvasc 2.5 mg daily.  3.  Benicar/HCT 40/25 one-half daily.  4.  Coumadin 5 mg one tablet on Monday, Wednesday, and Friday, other days      one-half tablet.  5.  Clonazepam 0.5 mg t.i.d. p.r.n.  6.  Black cohosh p.r.n.   SOCIAL HISTORY:  She lives in [place] with her mother-in-law.  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  She has no  history of alcohol, tobacco, or drug abuse.   FAMILY HISTORY:  Her  mother died at age 53 in childbirth.  Her father died  in his 46s of CHF.  She had one sister that died of a stroke.  There is no  family history of heart disease.   REVIEW OF SYSTEMS:  She denies any recent fevers, chills, or sweats.  She  has occasional night sweats but she feels these are hot flashes.  She has no  problems with the Coumadin and denies any hematemesis, hemoptysis, or  melena.  She denies depression or anxiety.  She has a few arthralgias.  She  has no chest pain, no shortness of breath, and slight chronic dyspnea on  exertion as described above.  She denies palpitations or edema.  Review of  systems is otherwise negative.   PHYSICAL EXAMINATION:  VITAL SIGNS:  Temperature 97.5, blood pressure  124/72, heart rate 58, respiratory rate 16, O2 saturation 99% on room air.  GENERAL:  She is a well-developed, slightly  obese African-American female in  no acute distress.  HEENT:  Her head is normocephalic, atraumatic with pupils are equal, round,  and reactive to light and accommodation.  NECK:  There is no JVD, no thyromegaly and no carotid bruits are  appreciated.  CARDIOVASCULAR:  Her heart is regular in rate and rhythm with an S4, but no  significant murmur or rubs noted.  LUNGS:  Clear to auscultation bilaterally.  ABDOMEN:  Soft and nontender with active bowel sounds.  EXTREMITIES:  There is no clubbing, cyanosis, edema.  She has 2+ distal  pulses in all four extremities.  MUSCULOSKELETAL:  There is no joint deformity or effusions.  NEUROLOGIC:  She is alert and oriented with cranial nerves II-XII grossly  intact.   Chest x-ray, laboratories, and EKG are all pending at the time of dictation.   ASSESSMENT/PLAN:  Pulmonary hypertension.  The patient is here for right-  sided catheterization with adenosine to evaluate for this.  Further  evaluation will depend on the results of this.  She is otherwise to be  continued on her home medications.      RB/MEDQ  D:   01/09/2005  T:  01/09/2005  Job:  [id number]   cc:   Kingsley Callander. Ouida Sills, MD  [address]  [place]  [place] [address]  Fax: [phone]   Shan Levans, M.D. [facility]   Vida Roller, M.D.  Fax: [phone]

## 2011-01-02 NOTE — Discharge Summary (Signed)
NAME:  [name] [name], [name] [name] NO.:  [id number]   MEDICAL RECORD NO.:  [id number]          PATIENT TYPE:  INP   LOCATION:  A206                          FACILITY:  [facility]   PHYSICIAN:  Dirk Dress. Katrinka Blazing, M.D.   DATE OF BIRTH:  Mar 13, 1933   DATE OF ADMISSION:  03/19/2004  DATE OF DISCHARGE:  08/20/2005LH                                 DISCHARGE SUMMARY   DISCHARGE DIAGNOSES:  1.  Cholelithiasis with cholecystitis.  2.  Hypertension.  3.  Postoperative acute pulmonary embolus.  4.  Mild pulmonary interstitial congestion.   PROCEDURE:  Laparoscopic cholecystectomy on March 21, 2004.   DISPOSITION:  The patient is discharged home in stable improved condition.   DISCHARGE MEDICATIONS:  1.  Reglan 10 mg a.c. and h.s.  2.  Prilosec 20 mg daily.  3.  Potassium chloride 20 mEq daily.  4.  Benicar HCT 20/25 daily.  5.  Coumadin 5 mg 1/2 tab daily.   FOLLOWUP:  The patient is scheduled to be seen in the office in one week.  She will have followup in the [facility] in three days, and she will  see Dr. Ouida Sills as needed.   HISTORY:  A 77 year old female admitted for treatment of severe abdominal  pain.  The patient gives a history of onset of severe abdominal pain 24  hours prior to admission.  The pain was epigastric and radiated through to  her back.  She had nausea without vomiting.  She was seen in the emergency  room where she was noted to have an acutely tender abdomen.  Ultrasound  showed multiple gallstones with a very large stone measuring greater than 2  cm.  She was admitted for evaluation and treatment.  Her only other major  problem was hypertension.   PHYSICAL EXAMINATION:  VITAL SIGNS:  She was afebrile.  Blood pressure  128/81, pulse 62, respirations 18, temperature 98.4, O2 saturation was 100%  on room air.  ABDOMEN:  Moderate epigastric and right upper quadrant tenderness.  Examination otherwise was unremarkable.   HOSPITAL COURSE:  The patient was  admitted for pain control and control of  nausea.  Because of her age and her history of hypertension with a history  of intermittent chest pain, it was felt that we needed to get an  echocardiogram prior to surgery.  The patient had continued episodes of  nausea with vomiting.  Echocardiogram was done and was unremarkable.  The  patient underwent laparoscopic cholecystectomy on March 21, 2004.  She was  found to have an edematous gallbladder with very short and cystic duct.  In  the early postoperative period, the patient had episodes of nausea with  vomiting.  This was controlled with Zofran.  On the day of March 25, 2004,  the patient became acutely short of breath while ambulating.  Her O2  saturation dropped to 77%.  She developed tightness in the top of her  abdomen.  Chest x-ray showed increased pulmonary interstitial edema with  bilateral effusions, and CT scan of the chest showed right lower lobe  peripheral embolus with major vessels which  were clear of embolus.  She was  started on Lovenox 60 mg subcutaneously q.12h.  Her Coumadin was withheld  because of her recent surgery.  Venous Doppler's were done and were  negative.  The patient was ambulated on March 28, 2004, and did very well.  She continued to have findings of pulmonary venous congestion and was  treated with diuretics.  She also had some episodes of SVT.  She was treated  with beta blockers and a cardiology consult was obtained.  Followup CT scan  of the chest was done on April 01, 2004.  This revealed no evidence of  residual pulmonary embolus.  Her hemoglobin remained stable.  She was  subsequently followed over the next few days.  She improved.  By April 04, 2004, she was significantly improved.  She did not have chest pain, she was  ambulating without difficulty, her PT was 25.6, with an INR of 3.3.  It was  felt that she was stable for discharge and she was discharged home on April 05, 2004, in satisfactory  condition with plans for followup in the Coumadin  Clinic and in the office.      LCS/MEDQ  D:  05/18/2004  T:  05/18/2004  Job:  [id number]   cc:   Kingsley Callander. Ouida Sills, M.D.  [address]  [place]  [place] [address]  Fax: [phone]

## 2011-01-02 NOTE — Op Note (Signed)
NAME:  [name] [name], [name] [name]                       ACCOUNT NO.:  [id number]   MEDICAL RECORD NO.:  [id number]                   PATIENT TYPE:  AMB   LOCATION:  DAY                                  FACILITY:  [facility]   PHYSICIAN:  R. Roetta Sessions, M.D.              DATE OF BIRTH:  08-05-1933   DATE OF PROCEDURE:  12/24/2003  DATE OF DISCHARGE:                                 OPERATIVE REPORT   PROCEDURE:  Screening colonoscopy.   ENDOSCOPIST:  Gerrit Friends. Rourk, M.D.   INDICATIONS FOR PROCEDURE:  The patient is a 77 year old lady kindly sent  over at the courtesy of Dr. Carylon Perches for colorectal cancer screening.  She  is devoid of any lower GI tract symptoms.  There is no family history of  colorectal neoplasia.  [name] [name] tells me that she had a sigmoidoscopy  with Dr. Ouida Sills, 5 years ago, and it was negative.  Colonoscopy is now being  done as a screening maneuver.  This approach has been discussed with the  patient at the bedside.  The potential risks, benefits, and alternatives  have been reviewed; questions answered.  She is agreeable.  Please see my  handwritten H&P for more information.   PROCEDURE NOTE:  O2 saturation, blood pressure, pulse and respirations were  monitored throughout the entire procedure.  Conscious sedation: Versed 3 mg IV, Demerol 50 mg IV in divided doses.   INSTRUMENT:  Olympus pediatric colonoscope.   FINDINGS:  Digital rectal exam revealed no abnormalities.   ENDOSCOPIC FINDINGS:  The prep was good.   RECTUM:  Examination of the rectal mucosa including the retroflex view of  the anal verge revealed no abnormalities.   COLON:  The colonic mucosa was surveyed from the rectosigmoid junction  through the left transverse and right colon to the area of the appendiceal  orifice, ileocecal valve, and cecum.  These structures were well seen and  photographed for the record.   From this level the scope was slowly withdrawn.  All previously mentioned  mucosal  surfaces were again seen.  Colonic mucosa appeared normal.  The  patient tolerated the procedure well and was reacted in endoscopy.   IMPRESSION:  1. Normal rectum.  2. Normal colon.   RECOMMENDATIONS:  Repeat colonoscopy in 10 years.      ___________________________________________                                            Jonathon Bellows, M.D.   RMR/MEDQ  D:  12/24/2003  T:  12/24/2003  Job:  [id number]   cc:   Kingsley Callander. Ouida Sills, M.D.  [address]  [place]  [place] [address]  Fax: [phone]   R. Roetta Sessions, M.D.  [address]  [place]  [place] [address]  Fax: [phone]

## 2011-01-02 NOTE — Assessment & Plan Note (Signed)
[facility]                            [facility] OFFICE NOTE   NAME:Broman, SRIYA KROEZE                    MRN:          [id number]  DATE:05/14/2006                            DOB:          [birth year]    HISTORY OF PRESENT ILLNESS:  The patient is a 77 year old female with a  history of pulmonary embolism diagnosed in 2005 after her knee surgery.  The  patient also had a catheterization which showed mild pulmonary hypertension.  She was seen by Dr. Delford Field.  She continues to do well.  She reports no chest  pain.  She does have mild dyspnea but no orthopnea, PND.  No palpitations or  syncope.  The patient presents for a routine follow-up.   MEDICATIONS:  Klor-Con, Klonopin, Benicar/hydrochlorothiazide 20/12.5,  Norvasc 2.5 mg daily, aspirin 81 mg daily.   PHYSICAL EXAMINATION:  VITAL SIGNS:  Blood pressure 142/70, heart rate 60  beats per minute, weight is 195 pounds.  NECK:  Normal carotid upstroke.  No carotid bruits.  LUNGS:  Clear breath sounds bilaterally.  HEART:  Regular rate and rhythm.  Normal S1 and S2.  No murmurs, gallops, or  rubs.  ABDOMEN:  Soft.  EXTREMITIES:  No clubbing, cyanosis, or edema.   IMPRESSION:  1. History of pulmonary embolus.  2. Mild pulmonary hypertension.  3. Dyslipidemia.  4. Hypertension.   PLAN:  1. The patient will have a follow-up echocardiographic study done to make      sure that she has not developed more severe pulmonary hypertension.  2. The patient otherwise can follow up with [place] in one year.       Learta Codding, MD,FACC     GED/MedQ  DD:  05/14/2006  DT:  05/16/2006  Job #:  [id number]

## 2011-05-21 ENCOUNTER — Other Ambulatory Visit (HOSPITAL_COMMUNITY): Payer: Self-pay | Admitting: Internal Medicine

## 2011-05-21 DIAGNOSIS — Z139 Encounter for screening, unspecified: Secondary | ICD-10-CM

## 2011-06-08 ENCOUNTER — Ambulatory Visit (HOSPITAL_COMMUNITY)
Admission: RE | Admit: 2011-06-08 | Discharge: 2011-06-08 | Disposition: A | Discharge status: Home / Self Care | Payer: Medicare Other | Source: Ambulatory Visit | Attending: Internal Medicine | Admitting: Internal Medicine

## 2011-06-08 DIAGNOSIS — Z139 Encounter for screening, unspecified: Secondary | ICD-10-CM

## 2011-06-08 DIAGNOSIS — Z1231 Encounter for screening mammogram for malignant neoplasm of breast: Secondary | ICD-10-CM | POA: Insufficient documentation

## 2012-03-04 ENCOUNTER — Encounter: Payer: Self-pay | Admitting: Cardiology

## 2012-04-08 ENCOUNTER — Ambulatory Visit (INDEPENDENT_AMBULATORY_CARE_PROVIDER_SITE_OTHER): Payer: Medicare Other | Admitting: Cardiology

## 2012-04-08 ENCOUNTER — Encounter: Payer: Self-pay | Admitting: Cardiology

## 2012-04-08 VITALS — BP 124/72 | HR 61 | Ht 65.0 in | Wt 182.0 lb

## 2012-04-08 DIAGNOSIS — R0989 Other specified symptoms and signs involving the circulatory and respiratory systems: Secondary | ICD-10-CM

## 2012-04-08 DIAGNOSIS — I10 Essential (primary) hypertension: Secondary | ICD-10-CM

## 2012-04-08 DIAGNOSIS — I2789 Other specified pulmonary heart diseases: Secondary | ICD-10-CM

## 2012-04-08 NOTE — Patient Instructions (Signed)
   Carotid dopplers  Office will contact with results Your physician wants you to follow up in:  1 year.  You will receive a reminder letter in the mail one-two months in advance.  If you don't receive a letter, please call our office to schedule the follow up appointment  

## 2012-04-21 ENCOUNTER — Encounter (INDEPENDENT_AMBULATORY_CARE_PROVIDER_SITE_OTHER): Payer: Medicare Other

## 2012-04-21 DIAGNOSIS — R0989 Other specified symptoms and signs involving the circulatory and respiratory systems: Secondary | ICD-10-CM

## 2012-04-24 DIAGNOSIS — R0989 Other specified symptoms and signs involving the circulatory and respiratory systems: Secondary | ICD-10-CM | POA: Insufficient documentation

## 2012-04-24 NOTE — Progress Notes (Signed)
Peyton Bottoms, MD, [facility] Board Certified in Adult Cardiovascular Medicine,Internal Medicine and Critical Care Medicine    CC:     followup patient with history of mild pulmonary hypertension                                                                             HPI:        Patient remains in NYHA class 1-2.  She denies any chest pain shortness of breath orthopnea or PND.  She does no palpitations or presyncope.  She has no claudication.  She stable from a cardiovascular perspective.  2 does of his history of diastolic dysfunction by previous echocardiogram her ejection fraction 60-65%.  Patient had prior right heart catheterization but no pulmonary vasodilatation testing was performed as the patient's PVR was only 3WU. EKG was reviewed in the office today and was within normal limits.  PMH: reviewed and listed in Problem List in Electronic Records (and see below) Past Medical History  Diagnosis Date  . Other and unspecified hyperlipidemia   . Unspecified essential hypertension   . Personal history of venous thrombosis and embolism   . Other chronic pulmonary heart diseases   . Tricuspid valve disease   . Mitral valve disease    No past surgical history on file.  Allergies/SH/FHX : available in Electronic Records for review  No Known Allergies History   Social History  . Marital Status: Widowed    Spouse Name: N/A    Number of Children: N/A  . Years of Education: N/A   Occupational History  . Retired    Social History Main Topics  . Smoking status: Never Smoker   . Smokeless tobacco: Never Used  . Alcohol Use: No  . Drug Use: No  . Sexually Active: Not on file   Other Topics Concern  . Not on file   Social History Narrative  . No narrative on file   No family history on file.  Medications: Current Outpatient Prescriptions  Medication Sig Dispense Refill  . aspirin 81 MG tablet Take 81 mg by mouth daily.      . chlorthalidone (HYGROTON) 25 MG tablet  Take 25 mg by mouth daily.      . clonazePAM (KLONOPIN) 0.5 MG tablet Take 0.5 mg by mouth 3 (three) times daily as needed.      Marland Kitchen losartan (COZAAR) 100 MG tablet Take 1 tablet by mouth Daily.      . simvastatin (ZOCOR) 10 MG tablet Take 10 mg by mouth at bedtime.        ROS: No nausea or vomiting. No fever or chills.No melena or hematochezia.No bleeding.No claudication  Physical Exam: BP 124/72  Pulse 61  Ht 5\' 5"  (1.651 m)  Wt 182 lb (82.555 kg)  BMI 30.29 kg/m2  SpO2 99% General:well-nourished African American female in no distress Neck:normal carotid upstroke and no carotid bruits Lungs:clear breath sounds bilaterally no wheezing Cardiac:regular rate and rhythm normal S1-S2 no murmurs or gallops Vascular:no edema.  Normal distal pulses Skin:warm and dry Physcologic:normal affect  12lead ZOX:WRUEAV sinus rhythm with PACs and nonspecific ST-T wave changes. Limited bedside ECHO:N/A No images are attached to the encounter.   I  reviewed and summarized the old records. I reviewed ECG and prior blood work.  Assessment and Plan  PULMONARY HYPERTENSION, MILD Mild and stable.  NYHA class 1-2.  Left carotid bruit Carotid Dopplers ordered.  Could represent a venous hum.  HYPERTENSION, UNSPECIFIED Blood pressure well-controlled no further adjustments in medications needed.    Patient Active Problem List  Diagnosis  . HYPERLIPIDEMIA-MIXED  . HYPERTENSION, UNSPECIFIED  . PULMONARY HYPERTENSION, MILD  . PULMONARY EMBOLISM, HX OF  . Left carotid bruit

## 2012-04-24 NOTE — Assessment & Plan Note (Signed)
Blood pressure well-controlled no further adjustments in medications needed. 

## 2012-04-24 NOTE — Assessment & Plan Note (Signed)
Mild and stable.  NYHA class 1-2.

## 2012-04-24 NOTE — Assessment & Plan Note (Signed)
Carotid Dopplers ordered.  Could represent a venous hum.

## 2012-04-25 ENCOUNTER — Encounter: Payer: Self-pay | Admitting: *Deleted

## 2012-05-04 ENCOUNTER — Other Ambulatory Visit (HOSPITAL_COMMUNITY): Payer: Self-pay | Admitting: Internal Medicine

## 2012-05-04 DIAGNOSIS — Z139 Encounter for screening, unspecified: Secondary | ICD-10-CM

## 2012-05-17 ENCOUNTER — Other Ambulatory Visit (HOSPITAL_COMMUNITY): Payer: Self-pay | Admitting: Internal Medicine

## 2012-05-17 DIAGNOSIS — E041 Nontoxic single thyroid nodule: Secondary | ICD-10-CM

## 2012-05-19 ENCOUNTER — Ambulatory Visit (HOSPITAL_COMMUNITY)
Admission: RE | Admit: 2012-05-19 | Discharge: 2012-05-19 | Disposition: A | Discharge status: Home / Self Care | Payer: Medicare Other | Source: Ambulatory Visit | Attending: Internal Medicine | Admitting: Internal Medicine

## 2012-05-19 DIAGNOSIS — E041 Nontoxic single thyroid nodule: Secondary | ICD-10-CM | POA: Insufficient documentation

## 2012-05-26 ENCOUNTER — Other Ambulatory Visit: Payer: Self-pay | Admitting: Internal Medicine

## 2012-05-26 DIAGNOSIS — E042 Nontoxic multinodular goiter: Secondary | ICD-10-CM

## 2012-06-01 ENCOUNTER — Other Ambulatory Visit (HOSPITAL_COMMUNITY)
Admission: RE | Admit: 2012-06-01 | Discharge: 2012-06-01 | Disposition: A | Discharge status: Home / Self Care | Payer: Medicare Other | Source: Ambulatory Visit | Attending: Diagnostic Radiology | Admitting: Diagnostic Radiology

## 2012-06-01 ENCOUNTER — Ambulatory Visit
Admission: RE | Admit: 2012-06-01 | Discharge: 2012-06-01 | Disposition: A | Discharge status: Home / Self Care | Payer: Medicare Other | Source: Ambulatory Visit | Attending: Internal Medicine | Admitting: Internal Medicine

## 2012-06-01 DIAGNOSIS — E042 Nontoxic multinodular goiter: Secondary | ICD-10-CM

## 2012-06-01 DIAGNOSIS — E049 Nontoxic goiter, unspecified: Secondary | ICD-10-CM | POA: Insufficient documentation

## 2012-06-09 ENCOUNTER — Ambulatory Visit (HOSPITAL_COMMUNITY)
Admission: RE | Admit: 2012-06-09 | Discharge: 2012-06-09 | Disposition: A | Discharge status: Home / Self Care | Payer: Medicare Other | Source: Ambulatory Visit | Attending: Internal Medicine | Admitting: Internal Medicine

## 2012-06-09 DIAGNOSIS — Z1231 Encounter for screening mammogram for malignant neoplasm of breast: Secondary | ICD-10-CM | POA: Insufficient documentation

## 2012-06-09 DIAGNOSIS — Z139 Encounter for screening, unspecified: Secondary | ICD-10-CM

## 2013-03-01 ENCOUNTER — Encounter (HOSPITAL_COMMUNITY): Payer: Self-pay | Admitting: *Deleted

## 2013-03-01 ENCOUNTER — Emergency Department (HOSPITAL_COMMUNITY)
Admission: EM | Admit: 2013-03-01 | Discharge: 2013-03-01 | Disposition: A | Discharge status: Home / Self Care | Payer: Medicare Other | Attending: Emergency Medicine | Admitting: Emergency Medicine

## 2013-03-01 DIAGNOSIS — I10 Essential (primary) hypertension: Secondary | ICD-10-CM | POA: Insufficient documentation

## 2013-03-01 DIAGNOSIS — Y929 Unspecified place or not applicable: Secondary | ICD-10-CM | POA: Insufficient documentation

## 2013-03-01 DIAGNOSIS — Z7982 Long term (current) use of aspirin: Secondary | ICD-10-CM | POA: Insufficient documentation

## 2013-03-01 DIAGNOSIS — Z86718 Personal history of other venous thrombosis and embolism: Secondary | ICD-10-CM | POA: Insufficient documentation

## 2013-03-01 DIAGNOSIS — S30860A Insect bite (nonvenomous) of lower back and pelvis, initial encounter: Secondary | ICD-10-CM | POA: Insufficient documentation

## 2013-03-01 DIAGNOSIS — Z79899 Other long term (current) drug therapy: Secondary | ICD-10-CM | POA: Insufficient documentation

## 2013-03-01 DIAGNOSIS — E785 Hyperlipidemia, unspecified: Secondary | ICD-10-CM | POA: Insufficient documentation

## 2013-03-01 DIAGNOSIS — W57XXXA Bitten or stung by nonvenomous insect and other nonvenomous arthropods, initial encounter: Secondary | ICD-10-CM

## 2013-03-01 DIAGNOSIS — Y939 Activity, unspecified: Secondary | ICD-10-CM | POA: Insufficient documentation

## 2013-03-01 DIAGNOSIS — Z8679 Personal history of other diseases of the circulatory system: Secondary | ICD-10-CM | POA: Insufficient documentation

## 2013-03-01 NOTE — ED Notes (Signed)
Tick to left flank x 2 days.

## 2013-03-03 NOTE — ED Provider Notes (Signed)
History    CSN: [id number] Arrival date & time 03/01/13  1035  First MD Initiated Contact with Patient 03/01/13 1057     Chief Complaint  Patient presents with  . Tick Removal   (Consider location/radiation/quality/duration/timing/severity/associated sxs/prior Treatment) HPI Comments: [name] [name] is a 79 y.o. female who presents to the Emergency Department requesting removal of a tick from her left side.  States she noticed the tick two days ago, but states she was afraid to remove it.  No home therapies tried, She denies any symptoms at this time.  No rash, fever, chills, or joint pains.  The history is provided by the patient.   Past Medical History  Diagnosis Date  . Other and unspecified hyperlipidemia   . Unspecified essential hypertension   . Personal history of venous thrombosis and embolism   . Other chronic pulmonary heart diseases   . Tricuspid valve disease   . Mitral valve disease    Past Surgical History  Procedure Laterality Date  . Cholecystectomy    . Abdominal hysterectomy     No family history on file. History  Substance Use Topics  . Smoking status: Never Smoker   . Smokeless tobacco: Never Used  . Alcohol Use: No   OB History   Grav Para Term Preterm Abortions TAB SAB Ect Mult Living                 Review of Systems  Constitutional: Negative for fever, chills and fatigue.  HENT: Negative for sore throat, trouble swallowing, neck pain and neck stiffness.   Respiratory: Negative for shortness of breath and wheezing.   Cardiovascular: Negative for chest pain.  Gastrointestinal: Negative for nausea, vomiting and abdominal pain.  Genitourinary: Negative for dysuria.  Musculoskeletal: Negative for myalgias, back pain and arthralgias.  Skin: Negative for rash.       Tick bite  Neurological: Negative for dizziness, weakness and numbness.  Hematological: Does not bruise/bleed easily.  All other systems reviewed and are  negative.    Allergies  Review of patient's allergies indicates no known allergies.  Home Medications   Current Outpatient Rx  Name  Route  Sig  Dispense  Refill  . aspirin 81 MG tablet   Oral   Take 81 mg by mouth daily.         . chlorthalidone (HYGROTON) 25 MG tablet   Oral   Take 25 mg by mouth daily.         . Multiple Vitamin (MULTIVITAMIN WITH MINERALS) TABS   Oral   Take 1 tablet by mouth daily.         Marland Kitchen olmesartan (BENICAR) 20 MG tablet   Oral   Take 20 mg by mouth daily.         . simvastatin (ZOCOR) 10 MG tablet   Oral   Take 10 mg by mouth at bedtime.          BP 148/57  Pulse 67  Temp(Src) 98.7 F (37.1 C) (Oral)  Resp 18  SpO2 100% Physical Exam  Nursing note and vitals reviewed. Constitutional: She appears well-developed and well-nourished. No distress.  HENT:  Head: Normocephalic and atraumatic.  Cardiovascular: Normal rate, regular rhythm, normal heart sounds and intact distal pulses.   No murmur heard. Pulmonary/Chest: Effort normal and breath sounds normal. No respiratory distress.  Abdominal: Soft. She exhibits no distension. There is no tenderness. There is no rebound.  Musculoskeletal: She exhibits no edema and no tenderness.  Neurological:  She is alert. She exhibits normal muscle tone. Coordination normal.  Skin: Skin is warm and dry. No rash noted. No erythema.  Very small non-engorged tick attached to skin at the left flank.  No induration or surrounding erythema.      ED Course  Procedures (including critical care time) Labs Reviewed - No data to display No results found. 1. Tick bite     MDM    Single, small tick attached to skin at the left flank.  Slight papule present.  No other rashes, joint pains, or fever. VSS.  Pt is well appearing.     Area was cleaned with alcohol , and tick removed completely by me using forceps.  Pt tolerated well     Advised pt of sx's of tick related illness and she agrees to f/u  with her PMD if needed  Jamol Ginyard L. Nekhi Liwanag, PA-C 03/03/13 1304

## 2013-03-05 NOTE — ED Provider Notes (Signed)
Medical screening examination/treatment/procedure(s) were performed by non-physician practitioner and as supervising physician I was immediately available for consultation/collaboration.  Wilmary Levit B. Tereasa Yilmaz, MD 03/05/13 1655 

## 2013-06-12 ENCOUNTER — Telehealth: Payer: Self-pay

## 2013-06-12 NOTE — Telephone Encounter (Signed)
Left message regarding follow up appointment due for patient from recall.

## 2013-06-12 NOTE — Telephone Encounter (Signed)
Patient is living in [place] for the present time. Will contact our office for an appointment when she comes back to [facility]

## 2013-06-13 ENCOUNTER — Telehealth: Payer: Self-pay

## 2013-10-17 NOTE — Telephone Encounter (Signed)
Opened in error

## 2013-11-27 IMAGING — US US SOFT TISSUE HEAD/NECK
1 series · 14 of 25 positions shown · non-contrast
Comparison: None available

CLINICAL DATA: Thyroid cyst

THYROID ULTRASOUND
TECHNIQUE: Ultrasound examination of the thyroid gland and adjacent
soft tissues was performed.

[Series 1: us soft tissue head/neck · 0.08mm/px · 14 of 53 slices shown]
[im 1/53]
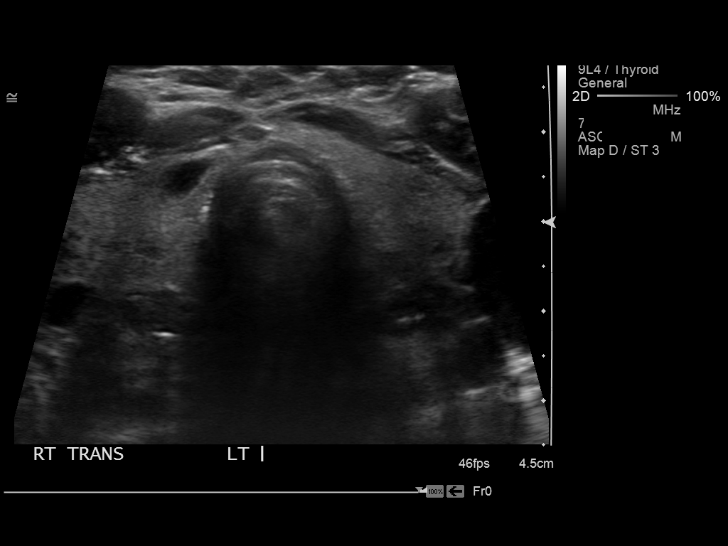
[im 5/53]
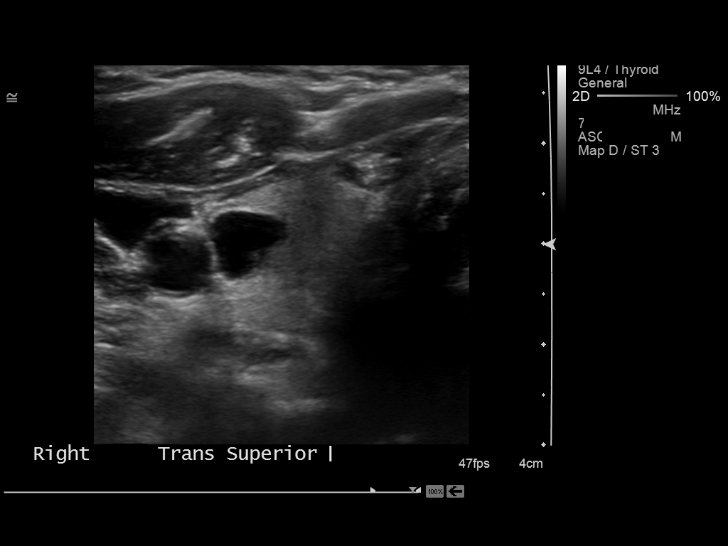
[im 9/53]
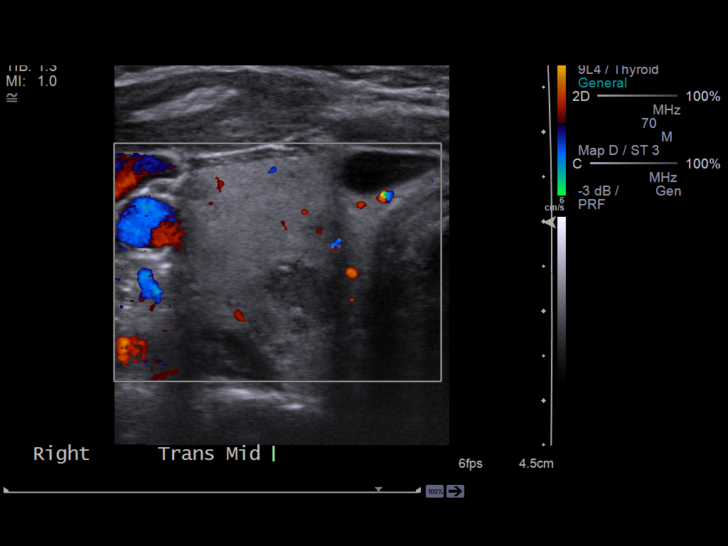
[im 14/53]
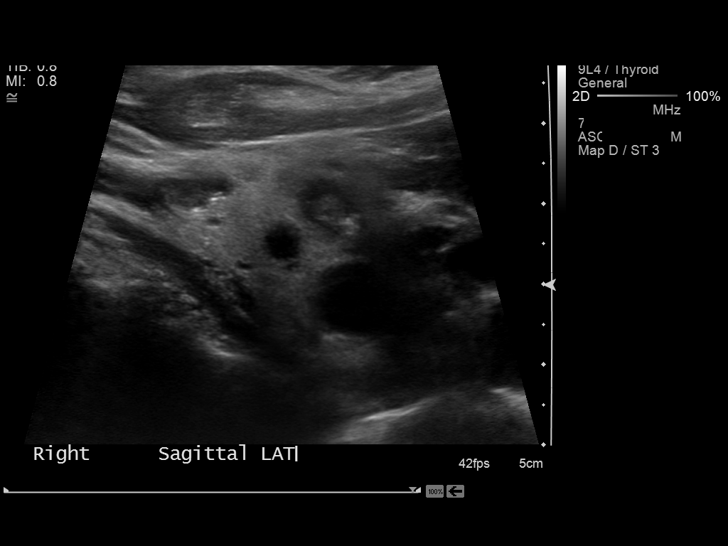
[im 18/53]
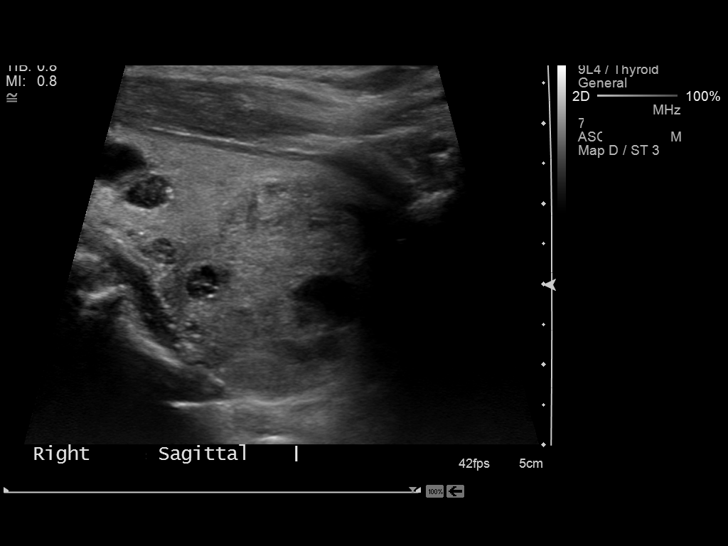
[im 20/53]
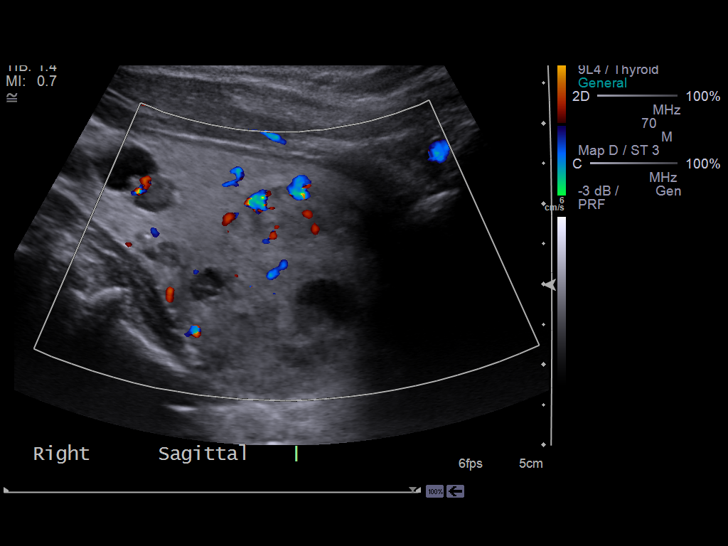
[im 24/53]
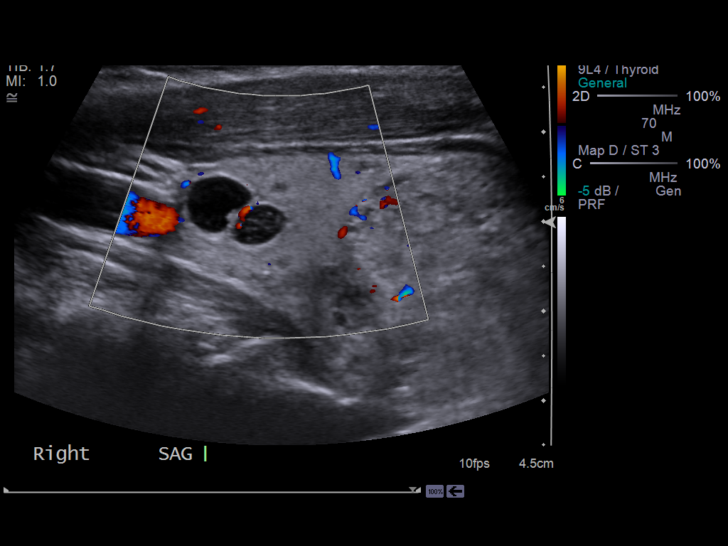
[im 29/53]
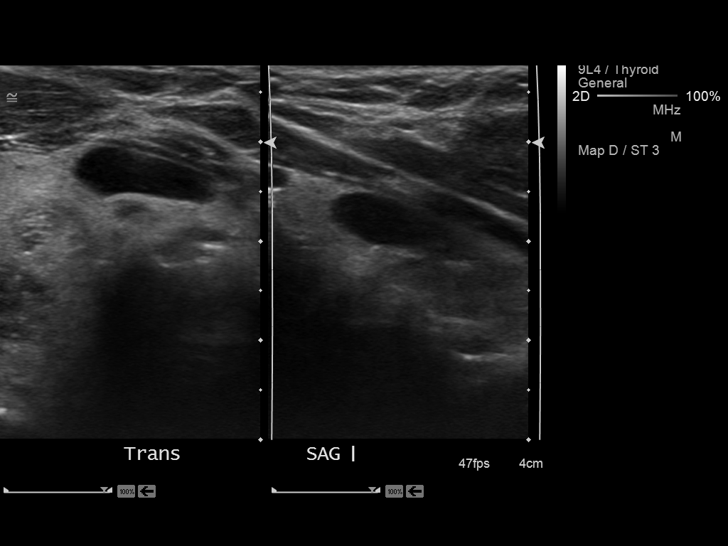
[im 33/53]
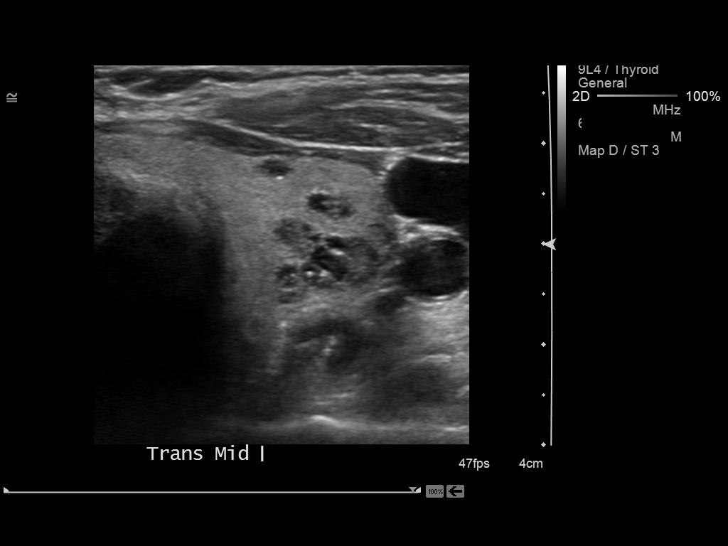
[im 35/53]
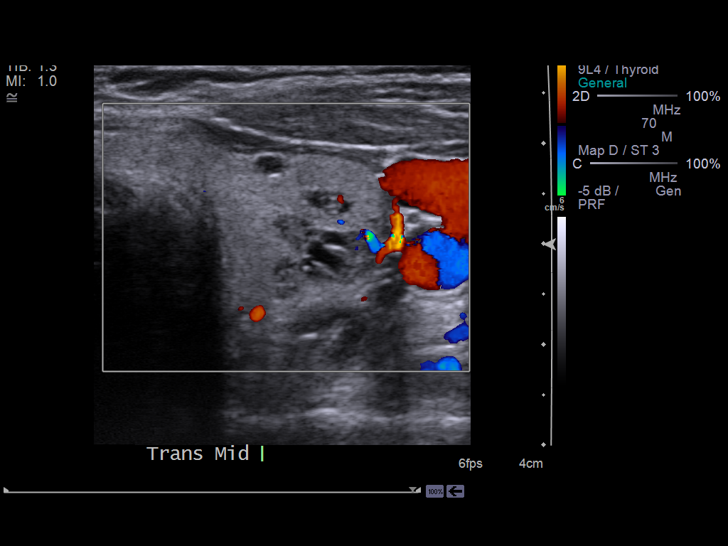
[im 40/53]
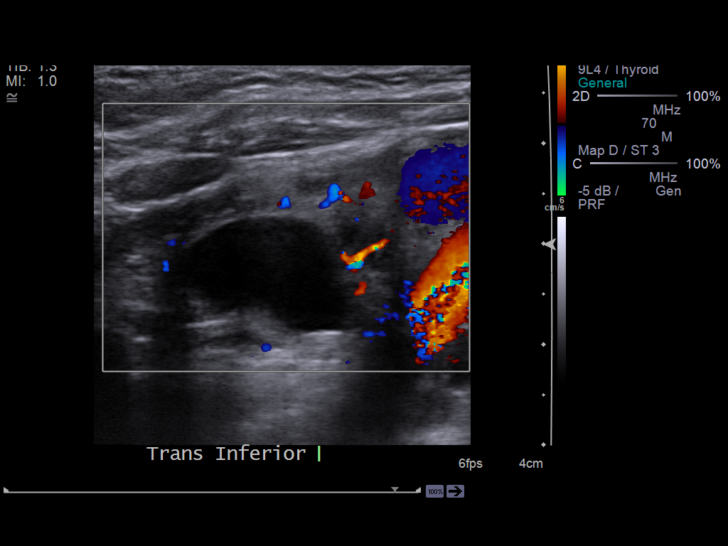
[im 44/53]
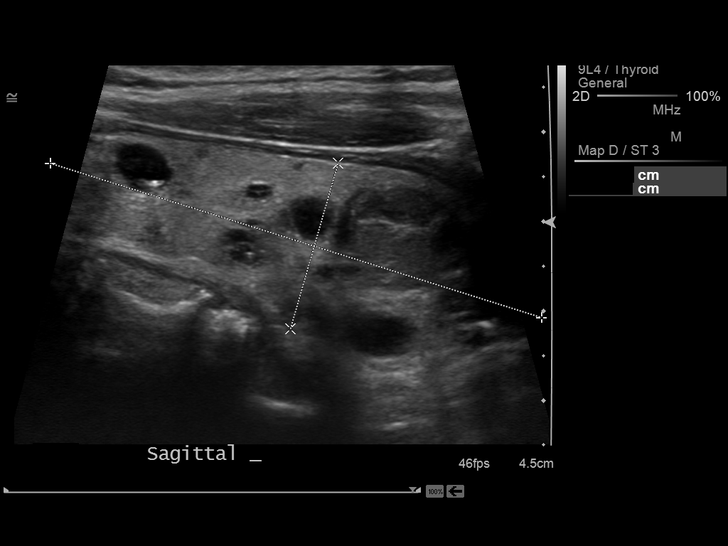
[im 48/53]
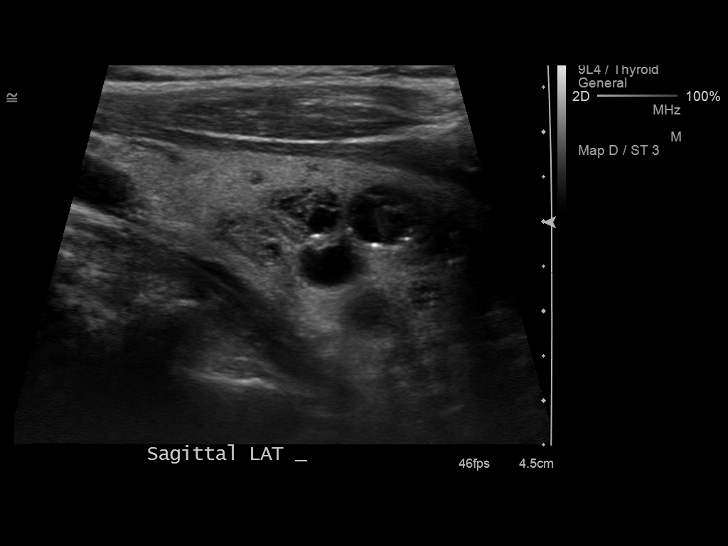
[im 53/53]
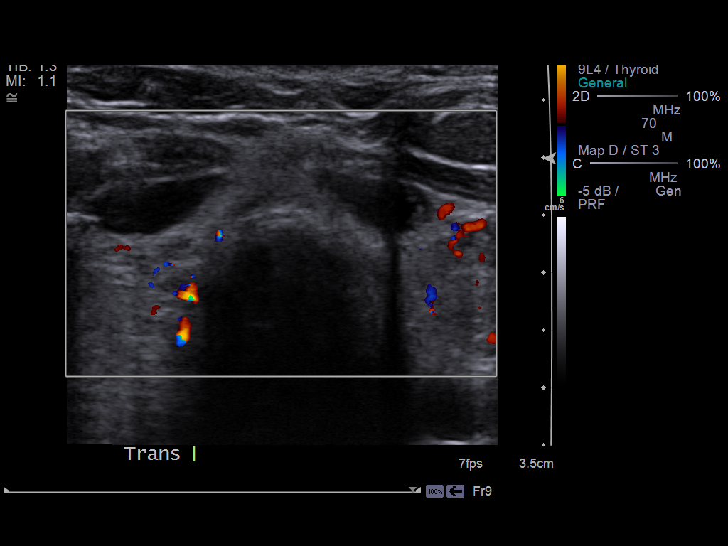

[14 of 25 positions shown; findings below may reference images not displayed]

FINDINGS: Right thyroid lobe:  25 x 29 x 52 mm, homogeneous background
echotexture
Left thyroid lobe:  19 x 19 x 58 mm
Isthmus:  2.8 mm in thickness

Focal nodules:
25 x 26 x 27 mm complex mostly solid, inferior right
5 x 11 x 14 mm cystic, right isthmus
12 x 13 x 22 mm cystic, inferior left
13 x 15 x 15 mm complex mostly solid with microcalcifications,
inferior left
Additional sub centimeter smaller lesions are noted bilaterally.

Lymphadenopathy:  None visualized.
IMPRESSION: 1.  Bilateral thyroid nodules and cysts as above.  The dominant
complex right and left nodules meet  consensus criteria for biopsy.
Ultrasound-guided fine needle aspiration should be considered, as
per the consensus statement: Management of Thyroid Nodules Detected
at [place]:  Society of Radiologists in Ultrasound Consensus Conference

## 2014-12-08 ENCOUNTER — Encounter (HOSPITAL_COMMUNITY): Payer: Self-pay | Admitting: *Deleted

## 2014-12-08 ENCOUNTER — Emergency Department (HOSPITAL_COMMUNITY)
Admission: EM | Admit: 2014-12-08 | Discharge: 2014-12-08 | Disposition: A | Discharge status: Home / Self Care | Payer: Medicare Other | Attending: Emergency Medicine | Admitting: Emergency Medicine

## 2014-12-08 DIAGNOSIS — Y9389 Activity, other specified: Secondary | ICD-10-CM | POA: Diagnosis not present

## 2014-12-08 DIAGNOSIS — Z86718 Personal history of other venous thrombosis and embolism: Secondary | ICD-10-CM | POA: Diagnosis not present

## 2014-12-08 DIAGNOSIS — I10 Essential (primary) hypertension: Secondary | ICD-10-CM | POA: Diagnosis not present

## 2014-12-08 DIAGNOSIS — Y998 Other external cause status: Secondary | ICD-10-CM | POA: Insufficient documentation

## 2014-12-08 DIAGNOSIS — Z7982 Long term (current) use of aspirin: Secondary | ICD-10-CM | POA: Diagnosis not present

## 2014-12-08 DIAGNOSIS — Z79899 Other long term (current) drug therapy: Secondary | ICD-10-CM | POA: Diagnosis not present

## 2014-12-08 DIAGNOSIS — S30861A Insect bite (nonvenomous) of abdominal wall, initial encounter: Secondary | ICD-10-CM | POA: Insufficient documentation

## 2014-12-08 DIAGNOSIS — Y9289 Other specified places as the place of occurrence of the external cause: Secondary | ICD-10-CM | POA: Insufficient documentation

## 2014-12-08 DIAGNOSIS — E785 Hyperlipidemia, unspecified: Secondary | ICD-10-CM | POA: Insufficient documentation

## 2014-12-08 DIAGNOSIS — W57XXXA Bitten or stung by nonvenomous insect and other nonvenomous arthropods, initial encounter: Secondary | ICD-10-CM | POA: Insufficient documentation

## 2014-12-08 NOTE — ED Notes (Signed)
Pt has a tick in her groin area.

## 2014-12-08 NOTE — Discharge Instructions (Signed)
Tick Bite Information Ticks are insects that attach themselves to the skin and draw blood for food. There are various types of ticks. Common types include wood ticks and deer ticks. Most ticks live in shrubs and grassy areas. Ticks can climb onto your body when you make contact with leaves or grass where the tick is waiting. The most common places on the body for ticks to attach themselves are the scalp, neck, armpits, waist, and groin. Most tick bites are harmless, but sometimes ticks carry germs that cause diseases. These germs can be spread to a person during the tick's feeding process. The chance of a disease spreading through a tick bite depends on:   The type of tick.  Time of year.   How long the tick is attached.   Geographic location.  HOW CAN YOU PREVENT TICK BITES? Take these steps to help prevent tick bites when you are outdoors:  Wear protective clothing. Long sleeves and long pants are best.   Wear white clothes so you can see ticks more easily.  Tuck your pant legs into your socks.   If walking on a trail, stay in the middle of the trail to avoid brushing against bushes.  Avoid walking through areas with long grass.  Put insect repellent on all exposed skin and along boot tops, pant legs, and sleeve cuffs.   Check clothing, hair, and skin repeatedly and before going inside.   Brush off any ticks that are not attached.  Take a shower or bath as soon as possible after being outdoors.  WHAT IS THE PROPER WAY TO REMOVE A TICK? Ticks should be removed as soon as possible to help prevent diseases caused by tick bites. 1. If latex gloves are available, put them on before trying to remove a tick.  2. Using fine-point tweezers, grasp the tick as close to the skin as possible. You may also use curved forceps or a tick removal tool. Grasp the tick as close to its head as possible. Avoid grasping the tick on its body. 3. Pull gently with steady upward pressure until  the tick lets go. Do not twist the tick or jerk it suddenly. This may break off the tick's head or mouth parts. 4. Do not squeeze or crush the tick's body. This could force disease-carrying fluids from the tick into your body.  5. After the tick is removed, wash the bite area and your hands with soap and water or other disinfectant such as alcohol. 6. Apply a small amount of antiseptic cream or ointment to the bite site.  7. Wash and disinfect any instruments that were used.  Do not try to remove a tick by applying a hot match, petroleum jelly, or fingernail polish to the tick. These methods do not work and may increase the chances of disease being spread from the tick bite.  WHEN SHOULD YOU SEEK MEDICAL CARE? Contact your health care provider if you are unable to remove a tick from your skin or if a part of the tick breaks off and is stuck in the skin.  After a tick bite, you need to be aware of signs and symptoms that could be related to diseases spread by ticks. Contact your health care provider if you develop any of the following in the days or weeks after the tick bite:  Unexplained fever.  Rash. A circular rash that appears days or weeks after the tick bite may indicate the possibility of Lyme disease. The rash may resemble   a target with a bull's-eye and may occur at a different part of your body than the tick bite.  Redness and swelling in the area of the tick bite.   Tender, swollen lymph glands.   Diarrhea.   Weight loss.   Cough.   Fatigue.   Muscle, joint, or bone pain.   Abdominal pain.   Headache.   Lethargy or a change in your level of consciousness.  Difficulty walking or moving your legs.   Numbness in the legs.   Paralysis.  Shortness of breath.   Confusion.   Repeated vomiting.  Document Released: 07/31/2000 Document Revised: 05/24/2013 Document Reviewed: 01/11/2013 Exit[employer] Patient Information 2015 [employer]. This information is  not intended to replace advice given to you by your health care provider. Make sure you discuss any questions you have with your health care provider.  

## 2014-12-10 NOTE — ED Provider Notes (Signed)
CSN: [id number]     Arrival date & time 12/08/14  2111 History   First MD Initiated Contact with Patient 12/08/14 2140     Chief Complaint  Patient presents with  . Tick Removal     (Consider location/radiation/quality/duration/timing/severity/associated sxs/prior Treatment) HPI   [name] [name] is a 81 y.o. female who presents to the Emergency Department requesting removal of a tick.  She states she noticed the tick just prior to arrival.  She denies any symptoms and states she did not try to remove it.     Past Medical History  Diagnosis Date  . Other and unspecified hyperlipidemia   . Unspecified essential hypertension   . Personal history of venous thrombosis and embolism   . Other chronic pulmonary heart diseases   . Tricuspid valve disease   . Mitral valve disease    Past Surgical History  Procedure Laterality Date  . Cholecystectomy    . Abdominal hysterectomy    . [name] surgery      polyp removed   History reviewed. No pertinent family history. History  Substance Use Topics  . Smoking status: Never Smoker   . Smokeless tobacco: Never Used  . Alcohol Use: No   OB History    No data available     Review of Systems  Constitutional: Negative for fever, chills, activity change, appetite change and fatigue.  Gastrointestinal: Negative for nausea and vomiting.  Musculoskeletal: Negative for myalgias and arthralgias.  Skin: Negative for color change, rash and wound.       Tick to the right groin  Neurological: Negative for dizziness, weakness and numbness.  All other systems reviewed and are negative.     Allergies  Review of patient's allergies indicates no known allergies.  Home Medications   Prior to Admission medications   Medication Sig Start Date End Date Taking? Authorizing Provider  aspirin 81 MG tablet Take 81 mg by mouth daily.    Historical Provider, MD  chlorthalidone (HYGROTON) 25 MG tablet Take 25 mg by mouth daily.    Historical  Provider, MD  Multiple Vitamin (MULTIVITAMIN WITH MINERALS) TABS Take 1 tablet by mouth daily.    Historical Provider, MD  olmesartan (BENICAR) 20 MG tablet Take 20 mg by mouth daily.    Historical Provider, MD  simvastatin (ZOCOR) 10 MG tablet Take 10 mg by mouth at bedtime.    Historical Provider, MD   There were no vitals taken for this visit. Physical Exam  Constitutional: She is oriented to person, place, and time. She appears well-developed and well-nourished. No distress.  HENT:  Head: Normocephalic and atraumatic.  Neck: Normal range of motion. Neck supple.  Cardiovascular: Normal rate, regular rhythm and intact distal pulses.   Pulmonary/Chest: Effort normal and breath sounds normal. No respiratory distress.  Musculoskeletal: Normal range of motion.  Lymphadenopathy:    She has no cervical adenopathy.  Neurological: She is alert and oriented to person, place, and time. She exhibits normal muscle tone. Coordination normal.  Skin: Skin is warm.  Small, thin, brown tick attached to skin of the right groin.  No surrounding erythema or edema.  Tick is not engorged.  Nursing note and vitals reviewed.   ED Course  Procedures (including critical care time) Labs Review Labs Reviewed - No data to display  Imaging Review No results found.   EKG Interpretation None      MDM   Final diagnoses:  Tick bite with subsequent removal of tick    Pt  is well appearing, non-toxic, ambulates well.   Tick removed completely without difficulty. No rash, fever, joint pains.  Pt given strict return precautions and agrees to return if any sx's develop.      Pauline Aus, PA-C 12/10/14 1701  Rolland Porter, MD 12/18/14 [phone]

## 2014-12-13 ENCOUNTER — Encounter (HOSPITAL_COMMUNITY): Payer: Self-pay

## 2014-12-13 ENCOUNTER — Emergency Department (HOSPITAL_COMMUNITY)
Admission: EM | Admit: 2014-12-13 | Discharge: 2014-12-13 | Disposition: A | Discharge status: Home / Self Care | Payer: Medicare Other | Attending: Emergency Medicine | Admitting: Emergency Medicine

## 2014-12-13 DIAGNOSIS — W57XXXA Bitten or stung by nonvenomous insect and other nonvenomous arthropods, initial encounter: Secondary | ICD-10-CM | POA: Insufficient documentation

## 2014-12-13 DIAGNOSIS — S30861A Insect bite (nonvenomous) of abdominal wall, initial encounter: Secondary | ICD-10-CM | POA: Insufficient documentation

## 2014-12-13 DIAGNOSIS — Y9389 Activity, other specified: Secondary | ICD-10-CM | POA: Insufficient documentation

## 2014-12-13 DIAGNOSIS — Z86718 Personal history of other venous thrombosis and embolism: Secondary | ICD-10-CM | POA: Insufficient documentation

## 2014-12-13 DIAGNOSIS — I10 Essential (primary) hypertension: Secondary | ICD-10-CM | POA: Insufficient documentation

## 2014-12-13 DIAGNOSIS — R110 Nausea: Secondary | ICD-10-CM

## 2014-12-13 DIAGNOSIS — Z79899 Other long term (current) drug therapy: Secondary | ICD-10-CM | POA: Diagnosis not present

## 2014-12-13 DIAGNOSIS — Z7982 Long term (current) use of aspirin: Secondary | ICD-10-CM | POA: Diagnosis not present

## 2014-12-13 DIAGNOSIS — Y9289 Other specified places as the place of occurrence of the external cause: Secondary | ICD-10-CM | POA: Insufficient documentation

## 2014-12-13 DIAGNOSIS — E785 Hyperlipidemia, unspecified: Secondary | ICD-10-CM | POA: Diagnosis not present

## 2014-12-13 DIAGNOSIS — Y998 Other external cause status: Secondary | ICD-10-CM | POA: Insufficient documentation

## 2014-12-13 MED ORDER — ONDANSETRON 8 MG PO TBDP: 8.0000 mg | ORAL_TABLET | Freq: Three times a day (TID) | ORAL | Status: AC | PRN

## 2014-12-13 MED ORDER — DOXYCYCLINE HYCLATE 100 MG PO CAPS: 100.0000 mg | ORAL_CAPSULE | Freq: Two times a day (BID) | ORAL | Status: AC

## 2014-12-13 NOTE — Discharge Instructions (Signed)
[place] Spotted Fever °[place] Spotted Fever (RMSF) is the oldest known tick-borne disease of people in the [place]. This disease was named because it was first described among people in the [place] area who had an illness characterized by a rash with red-purple-black spots. This disease is caused by a rickettsia (Rickettsia rickettsii), a bacteria carried by the tick. °The [place] wood tick and the [place] dog tick acquire and transmit the RMSF bacteria (pictures NOT actual size). When a larval, nymphal, or adult tick feeds on an infected rodent or larger animal, the tick can become infected. Infected adult ticks then feed on people who may then get RMSF. The tick transmits the disease to humans during a prolonged period of feeding that lasts many hours, days, or even a couple weeks. The bite is painless and frequently goes unnoticed. An infected female tick may also pass the rickettsial bacteria to her eggs that then may mature to be infected adult ticks. °The rickettsia that causes RMSF can also get into a person's body through damaged skin. A tick bite is not necessary. People can get RMSF if they crush a tick and get its blood or body fluids on their skin through a small cut or sore.  °DIAGNOSIS °Diagnosis is made by laboratory tests.  °TREATMENT °Treatment is with antibiotics (medications that kill rickettsia and other bacteria). Immediate treatment usually prevents death. °GEOGRAPHIC RANGE °This disease was reported only in the [place] until 1931. RMSF has more recently been described among individuals in all states except [place], [place], and [place]. The highest reported incidences of RMSF now occur among residents of [place], [place], [place], and the [place]. °TIME OF YEAR  °Most cases are diagnosed during late spring and summer when ticks are most active. However, especially in the warmer southern states, a few cases occur during the winter. °SYMPTOMS   °· Symptoms of RMSF begin from 2 to 14 days after a tick bite. The most common early symptoms are fever, muscle aches, and headache followed by nausea (feeling sick to your stomach) or vomiting. °· The RMSF rash is typically delayed until 3 or more days after symptom onset, and eventually develops in 9 of 10 infected patients by the fifth day of illness. °If the disease is not treated it can cause death. If you get a fever, headache, muscle aches, rash, nausea, or vomiting within 2 weeks of a possible tick bite or exposure, you should see your caregiver immediately. °PREVENTION °Ticks prefer to hide in shady, moist ground litter. They can often be found above the ground clinging to tall grass, brush, shrubs and low tree branches. They also inhabit lawns and gardens, especially at the edges of woodlands and around old stone walls. Within the areas where ticks generally live, no naturally vegetated area can be considered completely free of infected ticks. The best precaution against RMSF is to avoid contact with soil, leaf litter, and vegetation as much as possible in tick-infested areas. For those who enjoy gardening or walking in their yards, clear brush and mow tall grass around houses and at the edges of gardens. This may help reduce the tick population in the immediate area. Applications of chemical insecticides by a licensed professional in the spring (late May) and fall (September) will also control ticks, especially in heavily infested areas. Treatment will never get rid of all the ticks. Getting rid of small animal populations that host ticks will also decrease the tick population. When working in the garden, pruning   shrubs, or handling soil and vegetation, wear light-colored protective clothing and gloves. Spot-check often to prevent ticks from reaching the skin. Ticks cannot jump or fly. They will not drop from an above-ground perch onto a passing animal. Once a tick gains access to human skin it climbs  upward until it reaches a more protected area. For example, the back of the knee, groin, navel, armpit, ears, or nape of the neck. It then begins the slow process of embedding itself in the skin. °Campers, hikers, field workers, and others who spend time in wooded, brushy, or tall grassy areas can avoid exposure to ticks by using the following precautions: °· Wear light-colored clothing with a tight weave to spot ticks more easily and prevent contact with the skin. °· Wear long pants tucked into socks, long-sleeved shirts tucked into pants and enclosed shoes or boots along with insect repellent. °· Spray clothes with insect repellent containing either DEET or Permethrin. Only DEET can be used on exposed skin. Follow the manufacturer's directions carefully. °· Wear a hat and keep long hair pulled back. °· Stay on cleared, well-worn trails whenever possible. °· Spot-check yourself and others often for the presence of ticks on clothes. If you find one, there are likely to be others. Check thoroughly. °· Remove clothes after leaving tick-infested areas. If possible, wash them to eliminate any unseen ticks. Check yourself, your children and any pets from head to toe for the presence of ticks. °· Shower and shampoo. °You can greatly reduce your chances of contracting RMSF if you remove attached ticks as soon as possible. Regular checks of the body, including all body sites covered by hair (head, armpits, genitals), allow removal of the tick before rickettsial transmission. To remove an attached tick, use a forceps or tweezers to detach the intact tick without leaving mouth parts in the skin. The tick bite wound should be cleansed after tick removal. °Remember the most common symptoms of RMSF are fever, muscle aches, headache, and nausea or vomiting with a later onset of rash. If you get these symptoms after a tick bite and while living in an area where RMSF is found, RMSF should be suspected. If the disease is not  treated, it can cause death. See your caregiver immediately if you get these symptoms. Do this even if not aware of a tick bite. °Document Released: 11/15/2000 Document Revised: 12/18/2013 Document Reviewed: 07/08/2009 °[employer]® Patient Information ©2015 [employer]. This information is not intended to replace advice given to you by your health care provider. Make sure you discuss any questions you have with your health care provider. ° °

## 2014-12-13 NOTE — ED Provider Notes (Signed)
CSN: [id number]     Arrival date & time 12/13/14  1454 History   First MD Initiated Contact with Patient 12/13/14 1610     Chief Complaint  Patient presents with  . Nausea      The history is provided by the patient.   Patient reports today she has had nausea without vomiting.  She denies diarrhea.  No fevers or chills.  She states that 5 days ago she had a tick removed from her right groin.  She reports no redness or irritation to her right groin.  She denies chest pain or shortness of breath.  No abdominal pain.  Denies back pain flank pain.  No dysuria or urinary frequency.  She's concerned about the possibility of [facility] spotted fever.  She reports no nausea at this time.  She otherwise feels okay.   Past Medical History  Diagnosis Date  . Other and unspecified hyperlipidemia   . Unspecified essential hypertension   . Personal history of venous thrombosis and embolism   . Other chronic pulmonary heart diseases   . Tricuspid valve disease   . Mitral valve disease    Past Surgical History  Procedure Laterality Date  . Cholecystectomy    . Abdominal hysterectomy    . Colon surgery      polyp removed   No family history on file. History  Substance Use Topics  . Smoking status: Never Smoker   . Smokeless tobacco: Never Used  . Alcohol Use: No   OB History    No data available     Review of Systems  All other systems reviewed and are negative.     Allergies  Review of patient's allergies indicates no known allergies.  Home Medications   Prior to Admission medications   Medication Sig Start Date End Date Taking? Authorizing Provider  aspirin 81 MG tablet Take 81 mg by mouth daily.    Historical Provider, MD  chlorthalidone (HYGROTON) 25 MG tablet Take 25 mg by mouth daily.    Historical Provider, MD  Multiple Vitamin (MULTIVITAMIN WITH MINERALS) TABS Take 1 tablet by mouth daily.    Historical Provider, MD  olmesartan (BENICAR) 20 MG tablet Take 20 mg by  mouth daily.    Historical Provider, MD  simvastatin (ZOCOR) 10 MG tablet Take 10 mg by mouth at bedtime.    Historical Provider, MD   BP 146/82 mmHg  Pulse 83  Temp(Src) 98.2 F (36.8 C) (Oral)  Resp 18  SpO2 98% Physical Exam  Constitutional: She is oriented to person, place, and time. She appears well-developed and well-nourished. No distress.  HENT:  Head: Normocephalic and atraumatic.  Eyes: EOM are normal.  Neck: Normal range of motion.  Cardiovascular: Normal rate, regular rhythm and normal heart sounds.   Pulmonary/Chest: Effort normal and breath sounds normal.  Abdominal: Soft. She exhibits no distension. There is no tenderness.  Musculoskeletal: Normal range of motion.  Right groin demonstrates no erythema or tenderness or fluctuance.  Neurological: She is alert and oriented to person, place, and time.  Skin: Skin is warm and dry.  Psychiatric: She has a normal mood and affect. Judgment normal.  Nursing note and vitals reviewed.   ED Course  Procedures (including critical care time) Labs Review Labs Reviewed - No data to display  Imaging Review No results found.   EKG Interpretation   Date/Time:  Thursday December 13 2014 15:02:04 EDT Ventricular Rate:  95 PR Interval:  158 QRS Duration: 90 QT Interval:  370 QTC Calculation: 464 R Axis:   43 Text Interpretation:  Sinus rhythm with marked sinus arrhythmia ST \\T \ T  wave abnormality, consider inferolateral ischemia Abnormal ECG Confirmed  by COOK  MD, BRIAN ([id number]) on 12/13/2014 3:07:09 PM      MDM   Final diagnoses:  Nausea  Tick bite    Will cover for RMSF given recent tick exposure    Azalia BilisKevin Katisha Shimizu, MD 12/13/14 1904

## 2014-12-13 NOTE — ED Notes (Signed)
Pt reports had tick removed Saturday and reports yesterday started feeling nauseated.  Pt also c/o itching on head and back this morning.

## 2014-12-24 DIAGNOSIS — Z79899 Other long term (current) drug therapy: Secondary | ICD-10-CM | POA: Diagnosis not present

## 2014-12-24 DIAGNOSIS — R5383 Other fatigue: Secondary | ICD-10-CM | POA: Diagnosis not present

## 2014-12-24 DIAGNOSIS — I491 Atrial premature depolarization: Secondary | ICD-10-CM | POA: Diagnosis not present
